# Patient Record
Sex: Female | Born: 1980 | Race: White | Hispanic: No | Marital: Married | Smoking: Never smoker
Health system: Southern US, Community
[De-identification: ages and names within clinical notes are randomized; demographics above are authoritative.]

## PROBLEM LIST (undated history)

## (undated) ENCOUNTER — Inpatient Hospital Stay: Payer: Self-pay

## (undated) DIAGNOSIS — Z8489 Family history of other specified conditions: Secondary | ICD-10-CM

## (undated) DIAGNOSIS — O09299 Supervision of pregnancy with other poor reproductive or obstetric history, unspecified trimester: Secondary | ICD-10-CM

## (undated) DIAGNOSIS — C4491 Basal cell carcinoma of skin, unspecified: Secondary | ICD-10-CM

## (undated) DIAGNOSIS — R000 Tachycardia, unspecified: Secondary | ICD-10-CM

## (undated) DIAGNOSIS — T8859XA Other complications of anesthesia, initial encounter: Secondary | ICD-10-CM

## (undated) DIAGNOSIS — O4290 Premature rupture of membranes, unspecified as to length of time between rupture and onset of labor, unspecified weeks of gestation: Secondary | ICD-10-CM

## (undated) DIAGNOSIS — G4730 Sleep apnea, unspecified: Secondary | ICD-10-CM

## (undated) DIAGNOSIS — R42 Dizziness and giddiness: Secondary | ICD-10-CM

## (undated) DIAGNOSIS — Z6841 Body Mass Index (BMI) 40.0 and over, adult: Secondary | ICD-10-CM

## (undated) DIAGNOSIS — N390 Urinary tract infection, site not specified: Secondary | ICD-10-CM

## (undated) DIAGNOSIS — K219 Gastro-esophageal reflux disease without esophagitis: Secondary | ICD-10-CM

## (undated) DIAGNOSIS — O26849 Uterine size-date discrepancy, unspecified trimester: Secondary | ICD-10-CM

## (undated) DIAGNOSIS — R7309 Other abnormal glucose: Secondary | ICD-10-CM

## (undated) HISTORY — DX: Urinary tract infection, site not specified: N39.0

## (undated) HISTORY — DX: Premature rupture of membranes, unspecified as to length of time between rupture and onset of labor, unspecified weeks of gestation: O42.90

## (undated) HISTORY — DX: Supervision of pregnancy with other poor reproductive or obstetric history, unspecified trimester: O09.299

## (undated) HISTORY — DX: Other abnormal glucose: R73.09

## (undated) HISTORY — DX: Body Mass Index (BMI) 40.0 and over, adult: Z684

## (undated) HISTORY — DX: Basal cell carcinoma of skin, unspecified: C44.91

## (undated) HISTORY — DX: Morbid (severe) obesity due to excess calories: E66.01

## (undated) HISTORY — DX: Uterine size-date discrepancy, unspecified trimester: O26.849

---

## 2005-11-09 HISTORY — PX: CHOLECYSTECTOMY: SHX55

## 2006-11-09 DIAGNOSIS — O09299 Supervision of pregnancy with other poor reproductive or obstetric history, unspecified trimester: Secondary | ICD-10-CM

## 2006-11-09 HISTORY — DX: Supervision of pregnancy with other poor reproductive or obstetric history, unspecified trimester: O09.299

## 2008-11-09 HISTORY — PX: LAPAROSCOPIC BILATERAL SALPINGECTOMY: SHX5889

## 2008-11-09 HISTORY — PX: APPENDECTOMY: SHX54

## 2013-05-01 ENCOUNTER — Inpatient Hospital Stay: Payer: Self-pay | Admitting: Obstetrics and Gynecology

## 2013-05-01 LAB — BASIC METABOLIC PANEL
BUN: 9 mg/dL (ref 7–18)
Calcium, Total: 8.6 mg/dL (ref 8.5–10.1)
Co2: 21 mmol/L (ref 21–32)
Creatinine: 0.62 mg/dL (ref 0.60–1.30)
EGFR (Non-African Amer.): >60
Osmolality: 276 (ref 275–301)
Sodium: 139 mmol/L (ref 136–145)

## 2013-05-01 LAB — CREATININE, URINE, RANDOM: Creatinine, Urine Random: 46.5 mg/dL (ref 30.0–125.0)

## 2013-05-01 LAB — WBC: WBC: 18.3 10*3/uL — ABNORMAL HIGH (ref 3.6–11.0)

## 2013-05-01 LAB — URIC ACID: Uric Acid: 4.4 mg/dL (ref 2.6–6.0)

## 2013-05-01 LAB — PLATELET COUNT: Platelet: 167 10*3/uL (ref 150–440)

## 2013-05-01 LAB — HEMOGLOBIN: HGB: 12.4 g/dL (ref 12.0–16.0)

## 2013-05-01 LAB — HEMATOCRIT: HCT: 35.8 % (ref 35.0–47.0)

## 2013-05-01 LAB — RBC: RBC: 3.98 10*6/uL (ref 3.80–5.20)

## 2013-05-01 LAB — PROTEIN, URINE, RANDOM: Protein, Random Urine: 11 mg/dL (ref 0–12)

## 2013-05-02 LAB — HEMATOCRIT: HCT: 31.6 % — ABNORMAL LOW (ref 35.0–47.0)

## 2014-11-06 LAB — OB RESULTS CONSOLE RPR: RPR: NONREACTIVE

## 2014-11-06 LAB — OB RESULTS CONSOLE PLATELET COUNT: Platelets: 250 10*3/uL

## 2014-11-06 LAB — OB RESULTS CONSOLE ABO/RH: RH TYPE: NEGATIVE

## 2014-11-06 LAB — OB RESULTS CONSOLE GC/CHLAMYDIA
Chlamydia: NEGATIVE
Gonorrhea: NEGATIVE

## 2014-11-06 LAB — OB RESULTS CONSOLE TSH: TSH: 2.59

## 2014-11-06 LAB — OB RESULTS CONSOLE VARICELLA ZOSTER ANTIBODY, IGG: Varicella: IMMUNE

## 2014-11-06 LAB — OB RESULTS CONSOLE HGB/HCT, BLOOD
HCT: 38 %
Hemoglobin: 13 g/dL

## 2014-11-06 LAB — OB RESULTS CONSOLE ANTIBODY SCREEN: ANTIBODY SCREEN: NEGATIVE

## 2014-11-06 LAB — OB RESULTS CONSOLE HEPATITIS B SURFACE ANTIGEN: Hepatitis B Surface Ag: NEGATIVE

## 2014-11-06 LAB — OB RESULTS CONSOLE RUBELLA ANTIBODY, IGM: Rubella: IMMUNE

## 2014-11-06 LAB — OB RESULTS CONSOLE HIV ANTIBODY (ROUTINE TESTING): HIV: NONREACTIVE

## 2014-11-09 NOTE — L&D Delivery Note (Signed)
Delivery Note At 3:34 PM a viable female was delivered via Vaginal, Spontaneous Delivery (Presentation: ; Occiput Anterior).  APGAR: 8, 9; weight  .   Placenta status: Intact, Spontaneous Expressed.  Cord: 3 vessels with the following complications: None.  Cord pH: na  Anesthesia: Epidural  Episiotomy: Median Lacerations:  None Suture Repair: 2.0 chromic Est. Blood Loss (mL): 500  Mom to postpartum.  Baby to Couplet care / Skin to Skin.  Hassell Done A Kijana Cromie 05/22/2015, 3:56 PM

## 2015-03-05 LAB — OB RESULTS CONSOLE HGB/HCT, BLOOD
HCT: 35 %
Hemoglobin: 12 g/dL

## 2015-03-14 ENCOUNTER — Observation Stay
Admission: EM | Admit: 2015-03-14 | Discharge: 2015-03-14 | Disposition: A | Discharge status: Home / Self Care | Payer: BLUE CROSS/BLUE SHIELD | Attending: Obstetrics and Gynecology | Admitting: Obstetrics and Gynecology

## 2015-03-14 DIAGNOSIS — Z3A29 29 weeks gestation of pregnancy: Secondary | ICD-10-CM | POA: Diagnosis not present

## 2015-03-14 LAB — URINALYSIS COMPLETE WITH MICROSCOPIC (ARMC ONLY)
BILIRUBIN URINE: NEGATIVE
Bacteria, UA: NONE SEEN
Glucose, UA: NEGATIVE mg/dL
HGB URINE DIPSTICK: NEGATIVE
Ketones, ur: NEGATIVE mg/dL
LEUKOCYTES UA: NEGATIVE
NITRITE: NEGATIVE
Protein, ur: NEGATIVE mg/dL
Specific Gravity, Urine: 1.013 (ref 1.005–1.030)
pH: 6 (ref 5.0–8.0)

## 2015-03-19 NOTE — H&P (Signed)
L&D Evaluation:  History:  HPI 32 yowmf G3P1011 admitted in labor. [facility] 05/09/2013. EGA 38.[redacted] weeks gestation.   Patient's Medical History Increased BMI; Rh-, s/p Rhogam; H/O PIH prior pregnancy   Patient's Surgical History none   Medications Pre Natal Vitamins   Allergies NKDA   Social History none   Family History Non-Contributory   ROS:  ROS All systems were reviewed.  HEENT, CNS, GI, GU, Respiratory, CV, Renal and Musculoskeletal systems were found to be normal.   Exam:  Vital Signs stable   Urine Protein negative dipstick   General no apparent distress   Mental Status clear   Heart normal sinus rhythm   Abdomen gravid, non-tender, 8#8   Estimated Fetal Weight Average for gestational age   Back no CVAT   Edema 1+   Pelvic no external lesions, 7/90/-1/vtx/bowi bulging bag.   Mebranes AROM-Clear IUPC placed   FHT normal rate with no decels   Ucx regular   Skin dry   Lymph no lymphadenopathy   Other -/ATB-/NR/REI/HB-/HIV-/Glucola 119/GBS-   Impression:  Impression active labor, evaluation for PIH   Plan:  Plan monitor contractions and for cervical change, PIH panel   Comments PIH panel wnl. Epidural functional.   Electronic Signatures: DeFrancesco, Alanda Slim (MD)  (Signed 23-Jun-14 08:00)  Authored: L&D Evaluation   Last Updated: 23-Jun-14 08:00 by DeFrancesco, Alanda Slim (MD)

## 2015-03-21 DIAGNOSIS — O4290 Premature rupture of membranes, unspecified as to length of time between rupture and onset of labor, unspecified weeks of gestation: Secondary | ICD-10-CM

## 2015-03-21 HISTORY — DX: Premature rupture of membranes, unspecified as to length of time between rupture and onset of labor, unspecified weeks of gestation: O42.90

## 2015-04-18 ENCOUNTER — Encounter: Payer: Self-pay | Admitting: Obstetrics and Gynecology

## 2015-04-18 ENCOUNTER — Ambulatory Visit (INDEPENDENT_AMBULATORY_CARE_PROVIDER_SITE_OTHER): Payer: BLUE CROSS/BLUE SHIELD | Admitting: Obstetrics and Gynecology

## 2015-04-18 VITALS — BP 127/80 | HR 98 | Ht 68.0 in | Wt 264.3 lb

## 2015-04-18 DIAGNOSIS — Z331 Pregnant state, incidental: Secondary | ICD-10-CM

## 2015-04-18 DIAGNOSIS — Z1389 Encounter for screening for other disorder: Secondary | ICD-10-CM

## 2015-04-18 LAB — POCT URINALYSIS DIPSTICK
Bilirubin, UA: NEGATIVE
Glucose, UA: NEGATIVE
KETONES UA: NEGATIVE
Nitrite, UA: NEGATIVE
PH UA: 6
Protein, UA: NEGATIVE
RBC UA: NEGATIVE
SPEC GRAV UA: 1.015
Urobilinogen, UA: 0.2

## 2015-04-19 DIAGNOSIS — Z6831 Body mass index (BMI) 31.0-31.9, adult: Secondary | ICD-10-CM | POA: Insufficient documentation

## 2015-04-19 NOTE — Progress Notes (Signed)
ZIKA EXPOSURE SCREEN:  The patient has not traveled to a [place] endemic area within the past 6 months, nor has she had unprotected sex with a partner who has travelled to a [place] endemic region within the past 6 months. The patient has been advised to notify us if these factors change any time during this current pregnancy, so adequate testing and monitoring can be initiated.

## 2015-05-02 ENCOUNTER — Ambulatory Visit (INDEPENDENT_AMBULATORY_CARE_PROVIDER_SITE_OTHER): Payer: BLUE CROSS/BLUE SHIELD | Admitting: Obstetrics and Gynecology

## 2015-05-02 ENCOUNTER — Encounter: Payer: Self-pay | Admitting: Obstetrics and Gynecology

## 2015-05-02 VITALS — BP 129/82 | HR 92 | Wt 262.3 lb

## 2015-05-02 DIAGNOSIS — Z3685 Encounter for antenatal screening for Streptococcus B: Secondary | ICD-10-CM

## 2015-05-02 DIAGNOSIS — Z113 Encounter for screening for infections with a predominantly sexual mode of transmission: Secondary | ICD-10-CM

## 2015-05-02 DIAGNOSIS — Z3493 Encounter for supervision of normal pregnancy, unspecified, third trimester: Secondary | ICD-10-CM

## 2015-05-02 LAB — POCT URINALYSIS DIPSTICK
BILIRUBIN UA: NEGATIVE
Glucose, UA: NEGATIVE
KETONES UA: NEGATIVE
Leukocytes, UA: NEGATIVE
Nitrite, UA: NEGATIVE
PH UA: 6
PROTEIN UA: NEGATIVE
RBC UA: NEGATIVE
SPEC GRAV UA: 1.015
Urobilinogen, UA: 0.2

## 2015-05-02 NOTE — Progress Notes (Signed)
Pt c/o of ctx on off x 2 weeks. Very painful today. Nauseous and diarrhea.

## 2015-05-02 NOTE — Progress Notes (Signed)
36 week cultures done.Lightheadedness and fatigue today.  Vital signs normal.  Patient encouraged to maintain hydration stayout of direct sunlight.

## 2015-05-04 LAB — GC/CHLAMYDIA PROBE AMP
CHLAMYDIA, DNA PROBE: NEGATIVE
Neisseria gonorrhoeae by PCR: NEGATIVE

## 2015-05-04 LAB — STREP GP B NAA: Strep Gp B NAA: NEGATIVE

## 2015-05-06 ENCOUNTER — Encounter: Payer: Self-pay | Admitting: *Deleted

## 2015-05-06 ENCOUNTER — Telehealth: Payer: Self-pay

## 2015-05-06 ENCOUNTER — Observation Stay
Admission: EM | Admit: 2015-05-06 | Discharge: 2015-05-06 | Disposition: A | Discharge status: Home / Self Care | Payer: BLUE CROSS/BLUE SHIELD | Attending: Obstetrics and Gynecology | Admitting: Obstetrics and Gynecology

## 2015-05-06 DIAGNOSIS — Z3490 Encounter for supervision of normal pregnancy, unspecified, unspecified trimester: Principal | ICD-10-CM

## 2015-05-06 LAB — COMPREHENSIVE METABOLIC PANEL
ALBUMIN: 2.6 g/dL — AB (ref 3.5–5.0)
ALT: 29 U/L (ref 14–54)
ANION GAP: 8 (ref 5–15)
AST: 22 U/L (ref 15–41)
Alkaline Phosphatase: 148 U/L — ABNORMAL HIGH (ref 38–126)
BUN: 10 mg/dL (ref 6–20)
CALCIUM: 8.6 mg/dL — AB (ref 8.9–10.3)
CO2: 22 mmol/L (ref 22–32)
CREATININE: 0.63 mg/dL (ref 0.44–1.00)
Chloride: 106 mmol/L (ref 101–111)
GFR calc Af Amer: >60 mL/min (ref >60)
GFR calc non Af Amer: >60 mL/min (ref >60)
Glucose, Bld: 96 mg/dL (ref 65–99)
Potassium: 3.7 mmol/L (ref 3.5–5.1)
Sodium: 136 mmol/L (ref 135–145)
Total Bilirubin: 0.5 mg/dL (ref 0.3–1.2)
Total Protein: 6.7 g/dL (ref 6.5–8.1)

## 2015-05-06 LAB — PROTEIN / CREATININE RATIO, URINE
Creatinine, Urine: 71 mg/dL
PROTEIN CREATININE RATIO: 0.13 mg/mg{creat} (ref 0.00–0.15)
Total Protein, Urine: 9 mg/dL

## 2015-05-06 LAB — URIC ACID: Uric Acid, Serum: 4.4 mg/dL (ref 2.3–6.6)

## 2015-05-06 LAB — CBC
HEMATOCRIT: 36 % (ref 35.0–47.0)
Hemoglobin: 11.6 g/dL — ABNORMAL LOW (ref 12.0–16.0)
MCH: 27.9 pg (ref 26.0–34.0)
MCHC: 32.3 g/dL (ref 32.0–36.0)
MCV: 86.4 fL (ref 80.0–100.0)
Platelets: 191 10*3/uL (ref 150–440)
RBC: 4.16 MIL/uL (ref 3.80–5.20)
RDW: 15 % — ABNORMAL HIGH (ref 11.5–14.5)
WBC: 15.2 10*3/uL — ABNORMAL HIGH (ref 3.6–11.0)

## 2015-05-06 LAB — OB RESULTS CONSOLE GBS: GBS: NEGATIVE

## 2015-05-06 NOTE — Discharge Instructions (Signed)
Drink plenty of water.  Warm baths and pillows for comfort. May use Tylenol for pain as needed.  Call for change in intensity of contractions, heavy bleeding, rupture of membranes or decreased fetal movement.

## 2015-05-06 NOTE — Telephone Encounter (Signed)
Pt states in the middle of the nite she woke up to urinate and felt a popping type sensation. Pos fm. She has been having ctx on/off x 2 weeks. NO vag bleeding. 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 Advised to go inside and put on dry underwear. Monitor for now. If she starts to leak fluid or has vaginal bleeding she will let us know. Pt will f/u with Mad tomorrow.

## 2015-05-07 ENCOUNTER — Encounter: Payer: Self-pay | Admitting: Obstetrics and Gynecology

## 2015-05-07 ENCOUNTER — Ambulatory Visit (INDEPENDENT_AMBULATORY_CARE_PROVIDER_SITE_OTHER): Payer: BLUE CROSS/BLUE SHIELD | Admitting: Obstetrics and Gynecology

## 2015-05-07 VITALS — BP 132/83 | HR 89 | Wt 264.4 lb

## 2015-05-07 DIAGNOSIS — Z3483 Encounter for supervision of other normal pregnancy, third trimester: Secondary | ICD-10-CM

## 2015-05-07 DIAGNOSIS — Z3493 Encounter for supervision of normal pregnancy, unspecified, third trimester: Secondary | ICD-10-CM

## 2015-05-07 LAB — POCT URINALYSIS DIPSTICK
BILIRUBIN UA: NEGATIVE
Glucose, UA: NEGATIVE
KETONES UA: NEGATIVE
LEUKOCYTES UA: NEGATIVE
Nitrite, UA: NEGATIVE
PH UA: 6
PROTEIN UA: NEGATIVE
RBC UA: NEGATIVE
Spec Grav, UA: <=1.005
Urobilinogen, UA: 0.2

## 2015-05-07 NOTE — Progress Notes (Signed)
No evidence of ROM. Precautions given.Marland Kitchen

## 2015-05-07 NOTE — Progress Notes (Signed)
Ctx on/off. Seen in L&D. Nst reactive.

## 2015-05-14 ENCOUNTER — Encounter: Payer: Self-pay | Admitting: Obstetrics and Gynecology

## 2015-05-14 ENCOUNTER — Ambulatory Visit (INDEPENDENT_AMBULATORY_CARE_PROVIDER_SITE_OTHER): Payer: BLUE CROSS/BLUE SHIELD | Admitting: Obstetrics and Gynecology

## 2015-05-14 VITALS — BP 121/84 | HR 97 | Wt 267.7 lb

## 2015-05-14 DIAGNOSIS — Z3493 Encounter for supervision of normal pregnancy, unspecified, third trimester: Secondary | ICD-10-CM

## 2015-05-14 LAB — POCT URINALYSIS DIPSTICK
BILIRUBIN UA: NEGATIVE
Blood, UA: NEGATIVE
GLUCOSE UA: NEGATIVE
KETONES UA: NEGATIVE
LEUKOCYTES UA: NEGATIVE
Nitrite, UA: NEGATIVE
Protein, UA: NEGATIVE
Spec Grav, UA: 1.02
Urobilinogen, UA: 0.2
pH, UA: 6

## 2015-05-14 NOTE — Progress Notes (Signed)
Patient notes regular contractions and backache: Cervix unchanged.Consider induction of labor at 39.0 weeks

## 2015-05-14 NOTE — Progress Notes (Signed)
No complaints

## 2015-05-14 NOTE — Progress Notes (Signed)
Contractions continue throughout the day. Low back pain during contractions. No vaginal bleeding.

## 2015-05-21 ENCOUNTER — Inpatient Hospital Stay
Admission: EM | Admit: 2015-05-21 | Discharge: 2015-05-24 | DRG: 775 | Disposition: A | Discharge status: Home / Self Care | Payer: BLUE CROSS/BLUE SHIELD | Attending: Obstetrics and Gynecology | Admitting: Obstetrics and Gynecology

## 2015-05-21 ENCOUNTER — Ambulatory Visit (INDEPENDENT_AMBULATORY_CARE_PROVIDER_SITE_OTHER): Payer: BLUE CROSS/BLUE SHIELD | Admitting: Obstetrics and Gynecology

## 2015-05-21 ENCOUNTER — Encounter: Payer: Self-pay | Admitting: Obstetrics and Gynecology

## 2015-05-21 VITALS — BP 132/82 | HR 98 | Wt 268.9 lb

## 2015-05-21 DIAGNOSIS — O99214 Obesity complicating childbirth: Secondary | ICD-10-CM | POA: Diagnosis present

## 2015-05-21 DIAGNOSIS — O894 Spinal and epidural anesthesia-induced headache during the puerperium: Secondary | ICD-10-CM | POA: Diagnosis not present

## 2015-05-21 DIAGNOSIS — Z3483 Encounter for supervision of other normal pregnancy, third trimester: Secondary | ICD-10-CM | POA: Diagnosis not present

## 2015-05-21 DIAGNOSIS — Z3A39 39 weeks gestation of pregnancy: Secondary | ICD-10-CM | POA: Diagnosis present

## 2015-05-21 DIAGNOSIS — T413X5A Adverse effect of local anesthetics, initial encounter: Secondary | ICD-10-CM | POA: Diagnosis not present

## 2015-05-21 DIAGNOSIS — Z3493 Encounter for supervision of normal pregnancy, unspecified, third trimester: Secondary | ICD-10-CM

## 2015-05-21 DIAGNOSIS — Z6841 Body Mass Index (BMI) 40.0 and over, adult: Secondary | ICD-10-CM | POA: Diagnosis not present

## 2015-05-21 DIAGNOSIS — O4292 Full-term premature rupture of membranes, unspecified as to length of time between rupture and onset of labor: Principal | ICD-10-CM | POA: Diagnosis present

## 2015-05-21 DIAGNOSIS — Z3490 Encounter for supervision of normal pregnancy, unspecified, unspecified trimester: Secondary | ICD-10-CM

## 2015-05-21 LAB — POCT URINALYSIS DIPSTICK
BILIRUBIN UA: NEGATIVE
Glucose, UA: NEGATIVE
KETONES UA: NEGATIVE
Leukocytes, UA: NEGATIVE
Nitrite, UA: NEGATIVE
PH UA: 6
PROTEIN UA: NEGATIVE
RBC UA: NEGATIVE
Spec Grav, UA: 1.015
Urobilinogen, UA: 0.2

## 2015-05-21 LAB — CBC
HCT: 33.6 % — ABNORMAL LOW (ref 35.0–47.0)
Hemoglobin: 10.9 g/dL — ABNORMAL LOW (ref 12.0–16.0)
MCH: 27.6 pg (ref 26.0–34.0)
MCHC: 32.3 g/dL (ref 32.0–36.0)
MCV: 85.5 fL (ref 80.0–100.0)
Platelets: 156 10*3/uL (ref 150–440)
RBC: 3.93 MIL/uL (ref 3.80–5.20)
RDW: 16.1 % — ABNORMAL HIGH (ref 11.5–14.5)
WBC: 15.8 10*3/uL — ABNORMAL HIGH (ref 3.6–11.0)

## 2015-05-21 MED ORDER — LACTATED RINGERS IV SOLN
500.0000 mL | INTRAVENOUS | Status: DC | PRN
  Administered 2015-05-22: 1000 mL via INTRAVENOUS

## 2015-05-21 MED ORDER — SODIUM CHLORIDE 0.9 % IV SOLN: 1.0000 g | INTRAVENOUS | Status: DC

## 2015-05-21 MED ORDER — NYSTATIN-TRIAMCINOLONE 100000-0.1 UNIT/GM-% EX CREA: 1.0000 "application " | TOPICAL_CREAM | Freq: Two times a day (BID) | CUTANEOUS | Status: DC

## 2015-05-21 MED ORDER — TERBUTALINE SULFATE 1 MG/ML IJ SOLN: 0.2500 mg | Freq: Once | INTRAMUSCULAR | Status: AC | PRN

## 2015-05-21 MED ORDER — ONDANSETRON HCL 4 MG/2ML IJ SOLN: 4.0000 mg | Freq: Four times a day (QID) | INTRAMUSCULAR | Status: DC | PRN

## 2015-05-21 MED ORDER — MISOPROSTOL 25 MCG QUARTER TABLET
25.0000 ug | ORAL_TABLET | ORAL | Status: DC | PRN
  Administered 2015-05-21 – 2015-05-22 (×2): 25 ug via VAGINAL
  Filled 2015-05-21: qty 1

## 2015-05-21 MED ORDER — MISOPROSTOL 25 MCG QUARTER TABLET
ORAL_TABLET | ORAL | Status: AC
  Administered 2015-05-21: 25 ug via VAGINAL
  Filled 2015-05-21: qty 0.25

## 2015-05-21 MED ORDER — ACETAMINOPHEN 325 MG PO TABS
650.0000 mg | ORAL_TABLET | ORAL | Status: DC | PRN
Start: 2015-05-21 — End: 2015-05-23
  Filled 2015-05-21: qty 2

## 2015-05-21 MED ORDER — LACTATED RINGERS IV SOLN
INTRAVENOUS | Status: DC
  Administered 2015-05-21 – 2015-05-22 (×2): 125 mL/h via INTRAVENOUS
  Administered 2015-05-22: 10:00:00 via INTRAVENOUS

## 2015-05-21 MED ORDER — FENTANYL CITRATE (PF) 100 MCG/2ML IJ SOLN: 50.0000 ug | INTRAMUSCULAR | Status: DC | PRN

## 2015-05-21 MED ORDER — OXYCODONE-ACETAMINOPHEN 5-325 MG PO TABS
1.0000 | ORAL_TABLET | ORAL | Status: DC | PRN
  Filled 2015-05-21 (×2): qty 1

## 2015-05-21 MED ORDER — CITRIC ACID-SODIUM CITRATE 334-500 MG/5ML PO SOLN: 30.0000 mL | ORAL | Status: DC | PRN

## 2015-05-21 MED ORDER — LIDOCAINE HCL (PF) 1 % IJ SOLN
30.0000 mL | INTRAMUSCULAR | Status: DC | PRN
  Filled 2015-05-21: qty 30

## 2015-05-21 MED ORDER — OXYCODONE-ACETAMINOPHEN 5-325 MG PO TABS: 2.0000 | ORAL_TABLET | ORAL | Status: DC | PRN

## 2015-05-21 MED ORDER — OXYTOCIN 40 UNITS IN LACTATED RINGERS INFUSION - SIMPLE MED
62.5000 mL/h | INTRAVENOUS | Status: DC
  Filled 2015-05-21: qty 1000

## 2015-05-21 MED ORDER — OXYTOCIN BOLUS FROM INFUSION: 500.0000 mL | INTRAVENOUS | Status: DC

## 2015-05-21 NOTE — Progress Notes (Signed)
IOL Tonight-Cytotec/Pitocin. EFW 4000 gm.

## 2015-05-21 NOTE — Progress Notes (Signed)
Pt c/o of bumps on left side vulvar- painful to touch.

## 2015-05-22 ENCOUNTER — Inpatient Hospital Stay: Payer: BLUE CROSS/BLUE SHIELD | Admitting: Anesthesiology

## 2015-05-22 ENCOUNTER — Encounter: Payer: Self-pay | Admitting: *Deleted

## 2015-05-22 DIAGNOSIS — Z3483 Encounter for supervision of other normal pregnancy, third trimester: Secondary | ICD-10-CM

## 2015-05-22 LAB — ABO/RH: ABO/RH(D): A NEG

## 2015-05-22 MED ORDER — ACETAMINOPHEN 325 MG PO TABS
650.0000 mg | ORAL_TABLET | ORAL | Status: DC | PRN
Start: 2015-05-22 — End: 2015-05-24

## 2015-05-22 MED ORDER — PRENATAL MULTIVITAMIN CH
1.0000 | ORAL_TABLET | Freq: Every day | ORAL | Status: DC
  Administered 2015-05-23 – 2015-05-24 (×2): 1 via ORAL
  Filled 2015-05-22 (×2): qty 1

## 2015-05-22 MED ORDER — SIMETHICONE 80 MG PO CHEW: 80.0000 mg | CHEWABLE_TABLET | ORAL | Status: DC | PRN

## 2015-05-22 MED ORDER — LANOLIN HYDROUS EX OINT: TOPICAL_OINTMENT | CUTANEOUS | Status: DC | PRN

## 2015-05-22 MED ORDER — FENTANYL 2.5 MCG/ML W/ROPIVACAINE 0.2% IN NS 100 ML EPIDURAL INFUSION (ARMC-ANES)
10.0000 mL/h | EPIDURAL | Status: DC
  Administered 2015-05-22: 1 mL/h via EPIDURAL
  Filled 2015-05-22: qty 100

## 2015-05-22 MED ORDER — DIBUCAINE 1 % RE OINT: 1.0000 "application " | TOPICAL_OINTMENT | RECTAL | Status: DC | PRN

## 2015-05-22 MED ORDER — MISOPROSTOL 25 MCG QUARTER TABLET
ORAL_TABLET | ORAL | Status: AC
  Administered 2015-05-22: 25 ug via VAGINAL
  Filled 2015-05-22: qty 0.25

## 2015-05-22 MED ORDER — TETANUS-DIPHTH-ACELL PERTUSSIS 5-2.5-18.5 LF-MCG/0.5 IM SUSP: 0.5000 mL | INTRAMUSCULAR | Status: DC | PRN

## 2015-05-22 MED ORDER — LIDOCAINE-EPINEPHRINE (PF) 1.5 %-1:200000 IJ SOLN
INTRAMUSCULAR | Status: DC | PRN
  Administered 2015-05-22: 3 mL via PERINEURAL
  Administered 2015-05-22: 3 mL via EPIDURAL

## 2015-05-22 MED ORDER — WITCH HAZEL-GLYCERIN EX PADS: 1.0000 "application " | MEDICATED_PAD | CUTANEOUS | Status: DC | PRN

## 2015-05-22 MED ORDER — DIPHENHYDRAMINE HCL 25 MG PO CAPS: 25.0000 mg | ORAL_CAPSULE | Freq: Four times a day (QID) | ORAL | Status: DC | PRN

## 2015-05-22 MED ORDER — ONDANSETRON HCL 4 MG PO TABS: 4.0000 mg | ORAL_TABLET | ORAL | Status: DC | PRN

## 2015-05-22 MED ORDER — OXYTOCIN 40 UNITS IN LACTATED RINGERS INFUSION - SIMPLE MED
1.0000 m[IU]/min | INTRAVENOUS | Status: DC
  Administered 2015-05-22: 1 m[IU]/min via INTRAVENOUS
  Administered 2015-05-22: 10 m[IU]/min via INTRAVENOUS
  Filled 2015-05-22: qty 1000

## 2015-05-22 MED ORDER — DOCUSATE SODIUM 100 MG PO CAPS
100.0000 mg | ORAL_CAPSULE | Freq: Two times a day (BID) | ORAL | Status: DC
  Administered 2015-05-23 – 2015-05-24 (×3): 100 mg via ORAL
  Filled 2015-05-22 (×3): qty 1

## 2015-05-22 MED ORDER — MISOPROSTOL 200 MCG PO TABS
ORAL_TABLET | ORAL | Status: AC
  Filled 2015-05-22: qty 4

## 2015-05-22 MED ORDER — EPHEDRINE 5 MG/ML INJ
10.0000 mg | INTRAVENOUS | Status: DC | PRN
  Administered 2015-05-22: 10 mg via INTRAVENOUS
  Filled 2015-05-22: qty 10
  Filled 2015-05-22: qty 2

## 2015-05-22 MED ORDER — OXYTOCIN 10 UNIT/ML IJ SOLN
INTRAMUSCULAR | Status: AC
  Administered 2015-05-22: 10 [IU]
  Filled 2015-05-22: qty 2

## 2015-05-22 MED ORDER — AMMONIA AROMATIC IN INHA
RESPIRATORY_TRACT | Status: AC
  Filled 2015-05-22: qty 10

## 2015-05-22 MED ORDER — ONDANSETRON HCL 4 MG/2ML IJ SOLN: 4.0000 mg | INTRAMUSCULAR | Status: DC | PRN

## 2015-05-22 MED ORDER — FERROUS SULFATE 325 (65 FE) MG PO TABS
325.0000 mg | ORAL_TABLET | Freq: Two times a day (BID) | ORAL | Status: DC
  Administered 2015-05-23 – 2015-05-24 (×3): 325 mg via ORAL
  Filled 2015-05-22 (×4): qty 1

## 2015-05-22 MED ORDER — PHENYLEPHRINE 40 MCG/ML (10ML) SYRINGE FOR IV PUSH (FOR BLOOD PRESSURE SUPPORT)
80.0000 ug | PREFILLED_SYRINGE | INTRAVENOUS | Status: DC | PRN
  Filled 2015-05-22: qty 2

## 2015-05-22 MED ORDER — LIDOCAINE HCL (PF) 1 % IJ SOLN
INTRAMUSCULAR | Status: AC
  Filled 2015-05-22: qty 30

## 2015-05-22 MED ORDER — DIPHENHYDRAMINE HCL 50 MG/ML IJ SOLN: 12.5000 mg | INTRAMUSCULAR | Status: DC | PRN

## 2015-05-22 MED ORDER — BENZOCAINE-MENTHOL 20-0.5 % EX AERO
INHALATION_SPRAY | CUTANEOUS | Status: AC
  Filled 2015-05-22: qty 56

## 2015-05-22 MED ORDER — BUPIVACAINE HCL (PF) 0.25 % IJ SOLN
INTRAMUSCULAR | Status: DC | PRN
  Administered 2015-05-22 (×2): 4 mL

## 2015-05-22 MED ORDER — OXYCODONE-ACETAMINOPHEN 5-325 MG PO TABS
1.0000 | ORAL_TABLET | ORAL | Status: DC | PRN
  Administered 2015-05-22 – 2015-05-23 (×4): 1 via ORAL
  Filled 2015-05-22 (×2): qty 1

## 2015-05-22 MED ORDER — TERBUTALINE SULFATE 1 MG/ML IJ SOLN
0.2500 mg | Freq: Once | INTRAMUSCULAR | Status: AC | PRN
  Filled 2015-05-22: qty 1

## 2015-05-22 MED ORDER — BENZOCAINE-MENTHOL 20-0.5 % EX AERO
1.0000 "application " | INHALATION_SPRAY | CUTANEOUS | Status: DC | PRN
  Filled 2015-05-22: qty 56

## 2015-05-22 MED ORDER — OXYCODONE-ACETAMINOPHEN 5-325 MG PO TABS
2.0000 | ORAL_TABLET | ORAL | Status: DC | PRN
  Administered 2015-05-23 – 2015-05-24 (×3): 2 via ORAL
  Filled 2015-05-22 (×3): qty 2

## 2015-05-22 MED ORDER — LIDOCAINE HCL (PF) 1 % IJ SOLN
INTRAMUSCULAR | Status: DC | PRN
  Administered 2015-05-22 (×2): 3 mL via SUBCUTANEOUS

## 2015-05-22 MED ORDER — MEASLES, MUMPS & RUBELLA VAC ~~LOC~~ INJ: 0.5000 mL | INJECTION | Freq: Once | SUBCUTANEOUS | Status: DC

## 2015-05-22 MED ORDER — IBUPROFEN 800 MG PO TABS
800.0000 mg | ORAL_TABLET | Freq: Three times a day (TID) | ORAL | Status: DC
  Administered 2015-05-22 – 2015-05-24 (×6): 800 mg via ORAL
  Filled 2015-05-22 (×6): qty 1

## 2015-05-22 NOTE — Anesthesia Preprocedure Evaluation (Addendum)
Anesthesia Evaluation  Patient identified by MRN, date of birth, ID band Patient awake    Reviewed: Allergy & Precautions, NPO status , Patient's Chart, lab work & pertinent test results  History of Anesthesia Complications Negative for: history of anesthetic complications  Airway Mallampati: II  TM Distance: >3 FB Neck ROM: Full    Dental no notable dental hx.    Pulmonary neg pulmonary ROS,  breath sounds clear to auscultation  Pulmonary exam normal       Cardiovascular negative cardio ROS Normal cardiovascular exam+ dysrhythmias (pt states she has intermittent tacycardia) Rhythm:Regular Rate:Normal     Neuro/Psych negative neurological ROS  negative psych ROS   GI/Hepatic negative GI ROS, Neg liver ROS,   Endo/Other  negative endocrine ROS  Renal/GU negative Renal ROS  negative genitourinary   Musculoskeletal negative musculoskeletal ROS (+)   Abdominal   Peds negative pediatric ROS (+)  Hematology negative hematology ROS (+)   Anesthesia Other Findings   Reproductive/Obstetrics (+) Pregnancy                            Anesthesia Physical Anesthesia Plan  ASA: II  Anesthesia Plan: Epidural   Post-op Pain Management:    Induction:   Airway Management Planned:   Additional Equipment:   Intra-op Plan:   Post-operative Plan:   Informed Consent: I have reviewed the patients History and Physical, chart, labs and discussed the procedure including the risks, benefits and alternatives for the proposed anesthesia with the patient or authorized representative who has indicated his/her understanding and acceptance.     Plan Discussed with:   Anesthesia Plan Comments:         Anesthesia Quick Evaluation

## 2015-05-22 NOTE — H&P (Signed)
Delancey P Werden is a 34 y.o. female presenting for Cytotec/Pitocin IOL for Suspected fetal macrosomia at term . History OB History    Gravida Para Term Preterm AB TAB SAB Ectopic Multiple Living   4 2 2  0 1 0 1 0 0 2     Past Medical History  Diagnosis Date  . PROM (premature rupture of membranes) 03/21/2015    returning neg; nitrazine ?, betamehasone 12.5mg  IM q 24h x2, afi tomorrow and weekly afi 03/22/2015-13.5cm  . Hx of preeclampsia, prior pregnancy, currently pregnant 2008    11/14/2014- plan monitory bp, serial u/s,third trimester antepartum testing, baseline serum creatinite  . Prior fetal macrosomia, antepartum     limit weight gain 10-15pounds, early glucola  . Fetal size inconsistent with dates     03/05/2015- growth scan today 86th percentile growth  . Morbid obesity with BMI of 40.0-44.9, adult   . Elevated glucose tolerance test     3hr wnl  . Urinary tract infection, recurrent     11/14/2014- monthly urine cultures   Past Surgical History  Procedure Laterality Date  . Appendectomy  2010    laparoscopic  . Laparoscopic bilateral salpingectomy  2010    bilateral paratubal cyst excision  . Cholecystectomy  2007   Family History: family history includes Bone cancer in her sister; Diabetes in her father and mother; Hypertension in her father and mother; Rheum arthritis in her mother. Social History:  reports that she has never smoked. She has never used smokeless tobacco. She reports that she does not drink alcohol or use illicit drugs.   Prenatal Transfer Tool  Maternal Diabetes: No Genetic Screening: Normal Maternal Ultrasounds/Referrals: Normal Fetal Ultrasounds or other Referrals:  None Maternal Substance Abuse:  No Significant Maternal Medications:  None Significant Maternal Lab Results:  Lab values include: Group B Strep negative, Rh negative Other Comments:  Rh-; Prior Fetal Macrosomia; Prior Pre Eclampsia  ROS  Dilation: 2 Effacement (%): 70 Station:  -3 Exam by:: T.Roberts RN (cytotec 73mcg's inserted vaginally.) Blood pressure 96/51, pulse 77, temperature 98.1 F (36.7 C), temperature source Oral, resp. rate 18, last menstrual period 08/22/2014, unknown if currently breastfeeding. Exam Physical Exam  Prenatal labs: ABO, Rh: --/--/A NEG (07/12 2209) Antibody: NEG (07/12 2209) Rubella: Immune (12/29 0000) RPR: Nonreactive (12/29 0000)  HBsAg: Negative (12/29 0000)  HIV: Non-reactive (12/29 0000)  GBS: Negative (06/27 0000)   Assessment/Plan: 39.0 week IUP Rh- Prior Fetal Macrosomia Suspected Macrosomia; EFW 9#0     Hassell Done A Deashia Soule 05/22/2015, 7:59 AM

## 2015-05-22 NOTE — Anesthesia Procedure Notes (Addendum)
Epidural Patient location during procedure: OB Start time: 05/22/2015 9:35 AM End time: 05/22/2015 9:45 AM  Staffing Anesthesiologist: Lorane Gell Performed by: anesthesiologist   Preanesthetic Checklist Completed: patient identified, surgical consent, pre-op evaluation, timeout performed, IV checked, risks and benefits discussed and monitors and equipment checked  Epidural Patient position: sitting Prep: Betadine Patient monitoring: heart rate, continuous pulse ox and blood pressure Approach: midline Location: L4-L5 Injection technique: LOR saline  Needle:  Needle type: Tuohy  Needle gauge: 18 G Needle length: 9 cm and 9 Needle insertion depth: 9 cm Catheter type: closed end flexible Catheter size: 20 Guage Catheter at skin depth: 14 cm  Assessment Events: blood not aspirated, injection not painful, no injection resistance, negative IV test and no paresthesia  Additional Notes See quick note.  Patient tolerated the insertion well without complications.Reason for block:procedure for pain  Epidural Patient location during procedure: OB Start time: 05/22/2015 9:05 AM End time: 05/22/2015 9:22 AM  Staffing Anesthesiologist: Lorane Gell Resident/CRNA: Nelda Marseille  Preanesthetic Checklist Completed: patient identified, surgical consent, pre-op evaluation, timeout performed, IV checked, risks and benefits discussed and monitors and equipment checked  Epidural Patient position: sitting Prep: Betadine Patient monitoring: heart rate, continuous pulse ox and blood pressure Approach: midline Location: L3-L4 Injection technique: LOR saline  Needle:  Needle type: Tuohy  Needle gauge: 18 G Needle length: 9 cm and 9 Needle insertion depth: 9 cm Catheter type: closed end flexible Catheter size: 20 Guage Catheter at skin depth: 14 cm Test dose: 1.5% lidocaine with Epi 1:200 K and positive  Assessment Events: blood not aspirated, injection not painful, no injection  resistance, negative IV test and no paresthesia  Additional Notes Test dose positive for numbness to level of umbilicus.    Patient tolerated the insertion well without complications.Reason for block:procedure for pain

## 2015-05-23 LAB — CBC
HCT: 30.5 % — ABNORMAL LOW (ref 35.0–47.0)
Hemoglobin: 10 g/dL — ABNORMAL LOW (ref 12.0–16.0)
MCH: 28 pg (ref 26.0–34.0)
MCHC: 32.7 g/dL (ref 32.0–36.0)
MCV: 85.6 fL (ref 80.0–100.0)
Platelets: 129 10*3/uL — ABNORMAL LOW (ref 150–440)
RBC: 3.56 MIL/uL — ABNORMAL LOW (ref 3.80–5.20)
RDW: 16.4 % — AB (ref 11.5–14.5)
WBC: 15.3 10*3/uL — ABNORMAL HIGH (ref 3.6–11.0)

## 2015-05-23 LAB — RPR: RPR Ser Ql: NONREACTIVE

## 2015-05-23 MED ORDER — RHO D IMMUNE GLOBULIN 1500 UNIT/2ML IJ SOSY
300.0000 ug | PREFILLED_SYRINGE | Freq: Once | INTRAMUSCULAR | Status: AC
Start: 2015-05-23 — End: 2015-05-24
  Administered 2015-05-24: 300 ug via INTRAMUSCULAR
  Filled 2015-05-23: qty 2

## 2015-05-23 MED ORDER — AMMONIA AROMATIC IN INHA
RESPIRATORY_TRACT | Status: AC
  Filled 2015-05-23: qty 10

## 2015-05-23 NOTE — Anesthesia Postprocedure Evaluation (Signed)
  Anesthesia Post-op Note  Patient: Hannah Hudson  Procedure(s) Performed: epidural  Anesthesia type:epidural  Patient location: 336-A  Post pain: Pain level controlled  Post assessment: Post-op Vital signs reviewed, Patient's Cardiovascular Status Stable, Respiratory Function Stable, Patent Airway and No signs of Nausea or vomiting  Post vital signs: Reviewed and stable  Last Vitals:  Filed Vitals:   05/23/15 0726  BP: 131/80  Pulse: 80  Temp: 37.1 C  Resp: 20    Level of consciousness: awake, alert  and patient cooperative  Complications: No apparent anesthesia complications

## 2015-05-23 NOTE — Progress Notes (Signed)
Post Partum Day 1 Subjective: Positional HA's  Objective: Blood pressure 129/75, pulse 77, temperature 98.7 F (37.1 C), temperature source Oral, resp. rate 18, last menstrual period 08/22/2014, SpO2 98 %, unknown if currently breastfeeding.  Physical Exam:  General: alert and cooperative Lochia: appropriate Uterine Fundus: firm Incision: Episiotomy ok DVT Evaluation: No cords or calf tenderness.   Recent Labs  05/21/15 2234 05/23/15 0600  HGB 10.9* 10.0*  HCT 33.6* 30.5*    Assessment/Plan: Plan for discharge tomorrow  Anesthesia consult for possible HA due to wet tap during epidural placement. IVF, Caffeine ongoing intervention.   LOS: 2 days   Alanda Slim Cassidie Veiga 05/23/2015, 1:08 PM

## 2015-05-24 LAB — FETAL SCREEN: FETAL SCREEN: NEGATIVE

## 2015-05-24 MED ORDER — OXYCODONE-ACETAMINOPHEN 5-325 MG PO TABS: 2.0000 | ORAL_TABLET | ORAL | Status: DC | PRN

## 2015-05-24 MED ORDER — DOCUSATE SODIUM 100 MG PO CAPS: 100.0000 mg | ORAL_CAPSULE | Freq: Two times a day (BID) | ORAL | Status: DC

## 2015-05-24 MED ORDER — IBUPROFEN 800 MG PO TABS: 800.0000 mg | ORAL_TABLET | Freq: Three times a day (TID) | ORAL | Status: DC

## 2015-05-24 MED ORDER — FERROUS SULFATE 325 (65 FE) MG PO TABS: 325.0000 mg | ORAL_TABLET | Freq: Two times a day (BID) | ORAL | Status: DC

## 2015-05-24 NOTE — Progress Notes (Signed)
Discharge instructions reviewed with patient. Pt v/u of all instructions. Prescription given to pt. Pt to room in with infant in room 336 until he is discharged.

## 2015-05-24 NOTE — Discharge Summary (Signed)
Obstetric Discharge Summary Reason for Admission: induction of labor Prenatal Procedures: NST and ultrasound Intrapartum Procedures: spontaneous vaginal delivery and episiotomy Midline Postpartum Procedures: none Complications-Operative and Postpartum: spinal HA HEMOGLOBIN  Date Value Ref Range Status  05/23/2015 10.0* 12.0 - 16.0 g/dL Final  03/05/2015 12.0 g/dL Final   HGB  Date Value Ref Range Status  05/01/2013 12.4 12.0-16.0 g/dL Final   HCT  Date Value Ref Range Status  05/23/2015 30.5* 35.0 - 47.0 % Final  03/05/2015 35 % Final  05/02/2013 31.6* 35.0-47.0 % Final    Physical Exam:  General: alert and cooperative Lochia: appropriate Uterine Fundus: firm Incision: healing well DVT Evaluation: No evidence of DVT seen on physical exam. Negative Homan's sign.  Discharge Diagnoses: Term Pregnancy-delivered  Discharge Information: Date: 05/24/2015 Activity: unrestricted Diet: routine Medications: PNV, Ibuprofen, Colace, Iron and Percocet Condition: stable Instructions: refer to practice specific booklet Discharge to: home Follow-up Information    Follow up with Brayton Mars, MD In 6 weeks.   Specialties:  Obstetrics and Gynecology, Radiology   Why:  6 week Post Partum Check   Contact information:   [place] [address] [phone]       Newborn Data: Live born female  Birth Weight: 9 lb 1.9 oz (4135 g) APGAR: 8, 9  Home with mother.  Hannah Hudson 05/24/2015, 2:14 PM

## 2015-05-24 NOTE — Anesthesia Postprocedure Evaluation (Signed)
  Anesthesia Post-op Note  Patient: Hannah Hudson  Procedure(s) Performed: Post labor epidural  Anesthesia type:Epidural  Patient location: mother/baby unit 336  Post pain: Pain level controlled  Post assessment: Post-op Vital signs reviewed, Patient's Cardiovascular Status Stable, Respiratory Function Stable, Patent Airway and No signs of Nausea or vomiting.  Pt had a headache on 05/23/15.  This was a follow up this am.  Pt stated she felt much better and headache was gone.  Pt was sitting up in bed getting vital signs taken which were normal.    Post vital signs: Reviewed and stable  Last Vitals:  Filed Vitals:   05/24/15 0733  BP: 136/91  Pulse: 87  Temp: 36.4 C  Resp: 18    Level of consciousness: awake, alert  and patient cooperative  Complications: No apparent anesthesia complications

## 2015-05-26 LAB — RHOGAM INJECTION: UNIT DIVISION: 0

## 2015-05-27 ENCOUNTER — Encounter: Payer: Self-pay | Admitting: Obstetrics and Gynecology

## 2015-05-27 ENCOUNTER — Telehealth: Payer: Self-pay | Admitting: Obstetrics and Gynecology

## 2015-05-27 NOTE — Telephone Encounter (Signed)
Pt states the headaches began in the hospital. Percocet and IBP have been prescribed but since she had been home they had gotten much better until today.  She has been lying flat but does have to get up and tend to baby. Has started taking Percocet again and is unsure whether it is helping at this point. No n/v.  Pt states while she was in the hospital they told her she may need a blood patch. Pt. Advised to take pain medication as directed and appt for tomorrow at 9am made. I called pt back to check to see if pt was drinking caffeine and she said she was. Has a [employer] this am and now drinking a coke. Unsure if helping, pt states maybe a little.

## 2015-05-27 NOTE — Telephone Encounter (Signed)
Pt called and when she had the baby the anesthesiologist went to far with the epidural and she had a spinal fluid leak, she has been laying flat, but when she stands that when she gets the bad headache, but she has to get up and do things for the baby, so she is unsure what she needs to do since it hasn't gotten any better from when she left the hospital. Pt would like a call back.

## 2015-05-28 ENCOUNTER — Inpatient Hospital Stay: Admission: RE | Admit: 2015-05-28 | Payer: BLUE CROSS/BLUE SHIELD | Source: Ambulatory Visit

## 2015-05-28 ENCOUNTER — Ambulatory Visit (INDEPENDENT_AMBULATORY_CARE_PROVIDER_SITE_OTHER): Payer: BLUE CROSS/BLUE SHIELD | Admitting: Obstetrics and Gynecology

## 2015-05-28 ENCOUNTER — Encounter: Payer: Self-pay | Admitting: Obstetrics and Gynecology

## 2015-05-28 DIAGNOSIS — G971 Other reaction to spinal and lumbar puncture: Secondary | ICD-10-CM | POA: Diagnosis not present

## 2015-05-28 NOTE — Patient Instructions (Signed)
1.  Patient to go to preadmission testing for further evaluation

## 2015-05-28 NOTE — Progress Notes (Signed)
Patient ID: Hannah Hudson, female   DOB: 01-Dec-1980, 34 y.o.   MRN: [id number]   OB/GYN CONFERENCE NOTE:  Subjective:       Hannah Hudson is a 34 y.o. G47P3010 female who presents for a conference appointment. Current complaints include:  1.  Postural headache    H/a d/t epidural  Worse since she has been at home-  S/p svd 05/22/2015 Percocet and advil - pain when she stands in a 6-7 Lying down no pain  Gynecologic History No LMP recorded. Postpartum  Obstetric History OB History  Gravida Para Term Preterm AB SAB TAB Ectopic Multiple Living  4 3 3  0 1 1 0 0 0 0    # Outcome Date GA Lbr Len/2nd Weight Sex Delivery Anes PTL Lv  4 Term 05/22/15 102w0d / 00:09 9 lb 1.9 oz (4.135 kg) M Vag-Spont EPI    3 SAB           2 Term           1 Term               Past Medical History  Diagnosis Date  . PROM (premature rupture of membranes) 03/21/2015    returning neg; nitrazine ?, betamehasone 12.5mg  IM q 24h x2, afi tomorrow and weekly afi 03/22/2015-13.5cm  . Hx of preeclampsia, prior pregnancy, currently pregnant 2008    11/14/2014- plan monitory bp, serial u/s,third trimester antepartum testing, baseline serum creatinite  . Prior fetal macrosomia, antepartum     limit weight gain 10-15pounds, early glucola  . Fetal size inconsistent with dates     03/05/2015- growth scan today 86th percentile growth  . Morbid obesity with BMI of 40.0-44.9, adult   . Elevated glucose tolerance test     3hr wnl  . Urinary tract infection, recurrent     11/14/2014- monthly urine cultures    Past Surgical History  Procedure Laterality Date  . Appendectomy  2010    laparoscopic  . Laparoscopic bilateral salpingectomy  2010    bilateral paratubal cyst excision  . Cholecystectomy  2007    Current Outpatient Prescriptions on File Prior to Visit  Medication Sig Dispense Refill  . docusate sodium (COLACE) 100 MG capsule Take 1 capsule (100 mg total) by mouth 2 (two) times daily. 10 capsule 0  .  ferrous sulfate 325 (65 FE) MG tablet Take 1 tablet (325 mg total) by mouth 2 (two) times daily with a meal. 60 tablet 0  . ibuprofen (ADVIL,MOTRIN) 800 MG tablet Take 1 tablet (800 mg total) by mouth 3 (three) times daily. 30 tablet 0  . oxyCODONE-acetaminophen (PERCOCET/ROXICET) 5-325 MG per tablet Take 2 tablets by mouth every 4 (four) hours as needed (for pain scale greater than 7). 30 tablet 0  . oxymetazoline (AFRIN) 0.05 % nasal spray Place 1 spray into both nostrils 2 (two) times daily as needed for congestion.    . Prenatal Vit-Fe Fumarate-FA (PRENATAL MULTIVITAMIN) TABS tablet Take 1 tablet by mouth daily at 12 noon.     No current facility-administered medications on file prior to visit.    No Known Allergies  History   Social History  . Marital Status: Married    Spouse Name: N/A  . Number of Children: N/A  . Years of Education: N/A   Occupational History  . buisness Freight forwarder    Social History Main Topics  . Smoking status: Never Smoker   . Smokeless tobacco: Never Used  . Alcohol Use: No  .  Drug Use: No  . Sexual Activity:    Partners: Male   Other Topics Concern  . Not on file   Social History Narrative    Family History  Problem Relation Age of Onset  . Bone cancer Sister   . Hypertension Mother   . Hypertension Father   . Diabetes Mother   . Diabetes Father   . Rheum arthritis Mother     The following portions of the patient's history were reviewed and updated as appropriate: allergies, current medications, past family history, past medical history, past social history, past surgical history and problem list.  Review of Systems See HPI   Objective:   BP 139/87 mmHg  Pulse 93  Ht 5\' 6"  (4.709 m)  Wt 247 lb 12.8 oz (112.401 kg)  BMI 40.01 kg/m2   Assessment/Plan:   1. Spinal HA    Referral to Anesthesia-today   Brayton Mars, MD [facility]

## 2015-05-31 ENCOUNTER — Telehealth: Payer: Self-pay | Admitting: Obstetrics and Gynecology

## 2015-05-31 ENCOUNTER — Encounter: Payer: Self-pay | Admitting: Obstetrics and Gynecology

## 2015-05-31 ENCOUNTER — Ambulatory Visit (INDEPENDENT_AMBULATORY_CARE_PROVIDER_SITE_OTHER): Payer: BLUE CROSS/BLUE SHIELD | Admitting: Obstetrics and Gynecology

## 2015-05-31 VITALS — BP 135/80 | HR 102 | Temp 98.2°F | Ht 66.0 in | Wt 242.4 lb

## 2015-05-31 DIAGNOSIS — N61 Inflammatory disorders of breast: Secondary | ICD-10-CM | POA: Diagnosis not present

## 2015-05-31 MED ORDER — DICLOXACILLIN SODIUM 500 MG PO CAPS: 500.0000 mg | ORAL_CAPSULE | Freq: Four times a day (QID) | ORAL | Status: DC

## 2015-05-31 NOTE — Telephone Encounter (Signed)
Hannah Hudson CALLED AND HAS RED STREAKS ON HER BREAST AND WAS TOLD BY LACTATION CONSULTANT TO BE SEEN BY HER OB.

## 2015-05-31 NOTE — Telephone Encounter (Signed)
Red streaks on right breast noted yesterday. No fever or chills. Some pain when feeding due to dry cracked nipple and bleeding from nipplept  assosiated with this Unable to tell if skin warm due to gel pack but thinks it is not. Pt to come in at 3:00pm for an appt to see Dr D.

## 2015-05-31 NOTE — Progress Notes (Signed)
Patient ID: Hannah Hudson, female   DOB: [birth year], 34 y.o.   MRN: [id number]   Chief complaint: 1. Breast infection  Patient is postpartum and breast-feeding. She has noted changes in her right breast including the following:  Red streaks on outside of nipples - rt breast No fever,chills Nipple d/c - none- only cracked and bleeding Minimal tenderness is noted only during breast-feeding  OBJECTIVE: BP 135/80 mmHg  Pulse 102  Temp(Src) 98.2 F (36.8 C)  Ht 5\' 6"  (1.676 m)  Wt 242 lb 6.4 oz (109.952 kg)  BMI 39.14 kg/m2  Breastfeeding? Yes  Patient is a pleasant well-appearing white female in no acute distress. She is alert and oriented. Neck supple without thyromegaly or adenopathy Breasts: Left breast-lactation changes; no dominant masses adenopathy or nipple discharge Right breast-peri-areola are erythema; no fluctuance; no ulceration; no induration; minimal tenderness Bilateral nipples are unremarkable.  IMPRESSION: 1. Early mastoiditis, right breast  PLAN: 1. Dicloxacillin 500 mg 4 times a day 14 days 2. Increase by mouth fluid intake 3. Tylenol and Motrin as needed 4. Return in 2 weeks for follow-up

## 2015-05-31 NOTE — Patient Instructions (Signed)
1. Dicloxacillin 500 mg 4 times a day 14 days  2. Increase by mouth fluid intake  3. Tylenol and Motrin as needed  4. Return in 2 weeks for follow-up

## 2015-06-13 ENCOUNTER — Encounter: Payer: Self-pay | Admitting: Obstetrics and Gynecology

## 2015-06-13 ENCOUNTER — Ambulatory Visit (INDEPENDENT_AMBULATORY_CARE_PROVIDER_SITE_OTHER): Payer: BLUE CROSS/BLUE SHIELD | Admitting: Obstetrics and Gynecology

## 2015-06-13 VITALS — BP 117/79 | HR 76 | Temp 97.9°F | Ht 66.0 in | Wt 244.3 lb

## 2015-06-13 DIAGNOSIS — O9122 Nonpurulent mastitis associated with the puerperium: Secondary | ICD-10-CM

## 2015-06-13 NOTE — Progress Notes (Signed)
GYNECOLOGY PROGRESS NOTE  Subjective:    Patient ID: Hannah Hudson, female    DOB: 10/09/1981, 34 y.o.   MRN: [id number]  HPI  Patient is a 34 y.o. G40P3010 female who presents for 2 week f/u of postpartum mastitis. Has completed antibiotics. Denies complaints.  Notes that mastitis has resolved.    The following portions of the patient's history were reviewed and updated as appropriate: allergies, current medications, past family history, past medical history, past social history, past surgical history and problem list.  Review of Systems A comprehensive review of systems was negative.   Objective:   Blood pressure 117/79, pulse 76, temperature 97.9 F (36.6 C), height 5\' 6"  (1.676 m), weight 244 lb 5 oz (110.819 kg), currently breastfeeding. General appearance: alert and no distress  Breasts: normal appearance, no masses or tenderness, negative findings: palpation negative for masses or nodules, no erythema or warmth Abdomen: soft, non-tender; bowel sounds normal; no masses,  no organomegaly  Assessment:   Postpartum mastitis, resolved  Plan:   Patient with no further symptoms, no difficulty with breastfeeding.   RTC in 3 weeks for postpartu visit with Dr. Enzo Bi.    Rubie Maid, MD [facility] [id number] [facility]

## 2015-06-17 ENCOUNTER — Other Ambulatory Visit: Payer: Self-pay | Admitting: Obstetrics and Gynecology

## 2015-07-04 ENCOUNTER — Ambulatory Visit (INDEPENDENT_AMBULATORY_CARE_PROVIDER_SITE_OTHER): Payer: BLUE CROSS/BLUE SHIELD | Admitting: Obstetrics and Gynecology

## 2015-07-04 ENCOUNTER — Encounter: Payer: Self-pay | Admitting: Obstetrics and Gynecology

## 2015-07-04 DIAGNOSIS — Z30011 Encounter for initial prescription of contraceptive pills: Secondary | ICD-10-CM

## 2015-07-04 MED ORDER — NORETHINDRONE 0.35 MG PO TABS: 1.0000 | ORAL_TABLET | Freq: Every day | ORAL | Status: DC

## 2015-07-04 NOTE — Patient Instructions (Signed)
1.  Resume all activities without restriction. 2.  Begin Micronor 1 pill daily for contraception. 3.  Return in 6 months for annual exam.

## 2015-07-04 NOTE — Progress Notes (Signed)
Patient ID: Hannah Hudson, female   DOB: 1981-08-28, 34 y.o.   MRN: [id number] 6 week PP svd 05/22/2015- female 9.2lb  No IC since delivery Breastfeeding no issues- s/p mastitis (7/27/20016)with dicloxacillin Contraception- wants to know her options  Chief complaint: 1.  Six-week postpartum check6 week postpartum check  The patient presents today for her 6 week postpartum check following spontaneous vaginal delivery of a 9 lbs. 2 oz. Baby boy on 05/22/2015.  She is breast-feeding; she is status post mastitis therapy without complication.  Patient is not having any postpartum blues or depression. Contraception options were reviewed; patient desires to go on minipill until husband has vasectomy.  Past medical history, past surgical history, problem list, medications, and allergies were reviewed and updated.  Review of systems: Per HPI.  OBJECTIVE:Blood pressure 119/84, pulse 81, height 5\' 6"  (1.676 m), weight 244 lb 9.6 oz (110.95 kg), currently breastfeeding. Patient is a pleasant, well-appearing white female in no acute distress.  She is alert and oriented. Neck: Supple without thyromegaly or adenopathy. Breasts: Lactation changes noted; no evidence of mastitis. Abdomen: Soft, nontender, without organomegaly. Pelvic exam: External genitalia-normal BUS-normal. Vagina-normal Cervix-no lesions; no cervical motion tenderness Uterus-normal size, shape and consistency, mobile, nontender Adnexa-nonpalpable and nontender. Rectovaginal exam-external exam normal; sphincter tone normal; no rectal masses. Extremities: Without clubbing, cyanosis or edema.  IMPRESSION: 1.  Normal 6 week postpartum check. 2.  Status post sponges, vaginal delivery; pregnancy, located by fetal macrosomia with normal 3 hour gtt. 3.  Increased body mass index. 4.  Desires hormonal contraception.  PLAN: 1.  Resume all activities without restriction. 2.  Begin Micronor minipill. 3.  Return in 6 months for Annual exam  or when necessary.

## 2016-01-02 ENCOUNTER — Encounter: Payer: BLUE CROSS/BLUE SHIELD | Admitting: Obstetrics and Gynecology

## 2016-11-17 DIAGNOSIS — J312 Chronic pharyngitis: Secondary | ICD-10-CM | POA: Insufficient documentation

## 2017-11-18 DIAGNOSIS — E785 Hyperlipidemia, unspecified: Secondary | ICD-10-CM | POA: Insufficient documentation

## 2018-06-01 ENCOUNTER — Ambulatory Visit (INDEPENDENT_AMBULATORY_CARE_PROVIDER_SITE_OTHER): Payer: 59 | Admitting: Obstetrics and Gynecology

## 2018-06-01 ENCOUNTER — Other Ambulatory Visit (HOSPITAL_COMMUNITY)
Admission: RE | Admit: 2018-06-01 | Discharge: 2018-06-01 | Disposition: A | Discharge status: Home / Self Care | Payer: 59 | Source: Ambulatory Visit | Attending: Obstetrics and Gynecology | Admitting: Obstetrics and Gynecology

## 2018-06-01 ENCOUNTER — Encounter: Payer: Self-pay | Admitting: Obstetrics and Gynecology

## 2018-06-01 VITALS — BP 122/81 | HR 76 | Ht 66.0 in | Wt 194.0 lb

## 2018-06-01 DIAGNOSIS — Z124 Encounter for screening for malignant neoplasm of cervix: Secondary | ICD-10-CM | POA: Diagnosis not present

## 2018-06-01 DIAGNOSIS — N644 Mastodynia: Secondary | ICD-10-CM | POA: Diagnosis not present

## 2018-06-01 DIAGNOSIS — Z01419 Encounter for gynecological examination (general) (routine) without abnormal findings: Secondary | ICD-10-CM | POA: Insufficient documentation

## 2018-06-01 DIAGNOSIS — Z6831 Body mass index (BMI) 31.0-31.9, adult: Secondary | ICD-10-CM | POA: Diagnosis not present

## 2018-06-01 LAB — POCT URINALYSIS DIPSTICK
Bilirubin, UA: NEGATIVE
Blood, UA: NEGATIVE
Glucose, UA: NEGATIVE
KETONES UA: 15
Leukocytes, UA: NEGATIVE
Nitrite, UA: NEGATIVE
Protein, UA: POSITIVE — AB
Spec Grav, UA: 1.015 (ref 1.010–1.025)
UROBILINOGEN UA: NEGATIVE U/dL — AB
pH, UA: 6.5 (ref 5.0–8.0)

## 2018-06-01 NOTE — Addendum Note (Signed)
Addended by: Elmer Picker M on: 06/01/2018 11:28 AM   Modules accepted: Orders

## 2018-06-01 NOTE — Progress Notes (Signed)
Pt has slight concern about soreness of breasts  ANNUAL PREVENTATIVE CARE GYN  ENCOUNTER NOTE  Subjective:       Hannah Hudson is a 37 y.o. G63P3010 female here for a routine annual gynecologic exam.  Current complaints: 1.  Breast tenderness  Patient has not had gynecology follow-up in the past 3 years.  She is seeing Dr. Caryl Comes in internal medicine.  He has been following hyperlipidemia that is thought to be possibly related to her keto diet.  Over the past year she has lost 67 pounds doing the keto diet.  Goal is for approximately another 30 pound weight loss.  Menstrual cycles are regular every 24 to 28 days with 5 days of duration of flow.  She is not experiencing any significant dysmenorrhea or intermenstrual bleeding.  Contraception is vasectomy.  Bowel function is normal.  No Bladder function is normal.   Gynecologic History Patient's last menstrual period was 05/24/2018 (exact date). Contraception: vasectomy Last Pap: Normal-date uncertain Last mammogram: None  Obstetric History OB History  Gravida Para Term Preterm AB Living  4 3 3  0 1 3  SAB TAB Ectopic Multiple Live Births  1 0 0 0      # Outcome Date GA Lbr Len/2nd Weight Sex Delivery Anes PTL Lv  4 Term 05/22/15 [redacted]w[redacted]d / 00:09 9 lb 1.9 oz (4.135 kg) M Vag-Spont EPI    3 SAB           2 Term           1 Term             Past Medical History:  Diagnosis Date  . Elevated glucose tolerance test    3hr wnl  . Fetal size inconsistent with dates    03/05/2015- growth scan today 86th percentile growth  . Hx of preeclampsia, prior pregnancy, currently pregnant 2008   11/14/2014- plan monitory bp, serial u/s,third trimester antepartum testing, baseline serum creatinite  . Morbid obesity with BMI of 40.0-44.9, adult   . Prior fetal macrosomia, antepartum    limit weight gain 10-15pounds, early glucola  . PROM (premature rupture of membranes) 03/21/2015   returning neg; nitrazine ?, betamehasone 12.5mg  IM q 24h x2, afi  tomorrow and weekly afi 03/22/2015-13.5cm  . Urinary tract infection, recurrent    11/14/2014- monthly urine cultures    Past Surgical History:  Procedure Laterality Date  . APPENDECTOMY  2010   laparoscopic  . CHOLECYSTECTOMY  2007  . LAPAROSCOPIC BILATERAL SALPINGECTOMY  2010   bilateral paratubal cyst excision    Current Outpatient Medications on File Prior to Visit  Medication Sig Dispense Refill  . norethindrone (MICRONOR,CAMILA,ERRIN) 0.35 MG tablet Take 1 tablet (0.35 mg total) by mouth daily. (Patient not taking: Reported on 06/01/2018) 1 Package 11  . Prenatal Vit-Fe Fumarate-FA (PRENATAL MULTIVITAMIN) TABS tablet Take 1 tablet by mouth daily at 12 noon.     No current facility-administered medications on file prior to visit.     No Known Allergies  Social History   Socioeconomic History  . Marital status: Married    Spouse name: Not on file  . Number of children: Not on file  . Years of education: Not on file  . Highest education level: Not on file  Occupational History  . Occupation: Academic librarian  Social Needs  . Financial resource strain: Not on file  . Food insecurity:    Worry: Not on file    Inability: Not on file  . Transportation  needs:    Medical: Not on file    Non-medical: Not on file  Tobacco Use  . Smoking status: Never Smoker  . Smokeless tobacco: Never Used  Substance and Sexual Activity  . Alcohol use: No    Alcohol/week: 0.0 oz  . Drug use: No  . Sexual activity: Yes    Partners: Male  Lifestyle  . Physical activity:    Days per week: Not on file    Minutes per session: Not on file  . Stress: Not on file  Relationships  . Social connections:    Talks on phone: Not on file    Gets together: Not on file    Attends religious service: Not on file    Active member of club or organization: Not on file    Attends meetings of clubs or organizations: Not on file    Relationship status: Not on file  . Intimate partner violence:    Fear  of current or ex partner: Not on file    Emotionally abused: Not on file    Physically abused: Not on file    Forced sexual activity: Not on file  Other Topics Concern  . Not on file  Social History Narrative  . Not on file    Family History  Problem Relation Age of Onset  . Bone cancer Sister   . Hypertension Mother   . Hypertension Father   . Diabetes Mother   . Diabetes Father   . Rheum arthritis Mother     The following portions of the patient's history were reviewed and updated as appropriate: allergies, current medications, past family history, past medical history, past social history, past surgical history and problem list.  Review of Systems Review of Systems  Constitutional: Negative.   HENT: Negative.   Eyes: Negative.   Respiratory: Negative.   Cardiovascular: Negative.   Gastrointestinal: Negative.   Genitourinary: Negative.   Musculoskeletal:       Breast tenderness bilateral, possibly cyclic  Skin: Negative.   Neurological: Negative.   Endo/Heme/Allergies: Negative.   Psychiatric/Behavioral: Negative.       Objective:   BP 122/81   Pulse 76   Ht 5\' 6"  (1.676 m)   Wt 194 lb (88 kg)   LMP 05/24/2018 (Exact Date)   BMI 31.31 kg/m  CONSTITUTIONAL: Well-developed, well-nourished female in no acute distress.  PSYCHIATRIC: Normal mood and affect. Normal behavior. Normal judgment and thought content. [place]: Alert and oriented to person, place, and time. Normal muscle tone coordination. No cranial nerve deficit noted. HENT:  Normocephalic, atraumatic, External right and left ear normal.  EYES: Conjunctivae and EOM are normal. No scleral icterus.  NECK: Normal range of motion, supple, no masses.  Normal thyroid.  SKIN: Skin is warm and dry. No rash noted. Not diaphoretic. No erythema. No pallor. CARDIOVASCULAR: Normal heart rate noted, regular rhythm, no murmur. RESPIRATORY: Clear to auscultation bilaterally. Effort and breath sounds normal, no  problems with respiration noted. BREASTS: Symmetric in size. No masses, skin changes, nipple drainage, or lymphadenopathy. ABDOMEN: Soft, normal bowel sounds, no distention noted.  No tenderness, rebound or guarding.  BLADDER: Normal PELVIC:  External Genitalia: Normal  BUS: Normal  Vagina: Normal estrogen effect  Cervix: Normal; parous, mobile, nontender, no significant discharge  Uterus: Normal; mobile, midplane, top normal size and shape, nontender  Adnexa: Normal; nonpalpable nontender  RV: External Exam NormaI  MUSCULOSKELETAL: Normal range of motion. No tenderness.  No cyanosis, clubbing, or edema.  2+ distal pulses. LYMPHATIC:  No Axillary, Supraclavicular, or Inguinal Adenopathy.    Assessment:   Annual gynecologic examination 37 y.o. Contraception: vasectomy BMI 31.1 and Obesity 1 Problem List Items Addressed This Visit    None    Visit Diagnoses    Encounter for well woman exam with routine gynecological exam    -  Primary    Breast tenderness; normal exam  Plan:  Pap: Pap Co Test Mammogram: Not Ordered and Not Indicated Stool Guaiac Testing:  Not Indicated Labs: Done through primary care Routine preventative health maintenance measures emphasized: Exercise/Diet/Weight control, Tobacco Warnings and Alcohol/Substance use risks Contraception-vasectomy Decrease caffeine and chocolate intake Maintain breast tenderness diary to check for cyclic nature of discomfort Return to [place], [place]   Note: This dictation was prepared with Dragon dictation along with smaller phrase technology. Any transcriptional errors that result from this process are unintentional.

## 2018-06-01 NOTE — Patient Instructions (Signed)
1.  Pap smear is done. 2.  Self breast awareness is encouraged 3.  Decrease caffeine intake and chocolate intake to help with breast tenderness 4.  Monitor symptoms of breast tenderness to see if they are cyclic in nature 5.  Screening labs are done through primary care 6.  Continue with healthy eating, exercise, and control weight loss 7.  Contraception-vasectomy 8.  Return in 1 year for annual exam  Health Maintenance, Female Adopting a healthy lifestyle and getting preventive care can go a long way to promote health and wellness. Talk with your health care provider about what schedule of regular examinations is right for you. This is a good chance for you to check in with your provider about disease prevention and staying healthy. In between checkups, there are plenty of things you can do on your own. Experts have done a lot of research about which lifestyle changes and preventive measures are most likely to keep you healthy. Ask your health care provider for more information. Weight and diet Eat a healthy diet  Be sure to include plenty of vegetables, fruits, low-fat dairy products, and lean protein.  Do not eat a lot of foods high in solid fats, added sugars, or salt.  Get regular exercise. This is one of the most important things you can do for your health. ? Most adults should exercise for at least 150 minutes each week. The exercise should increase your heart rate and make you sweat (moderate-intensity exercise). ? Most adults should also do strengthening exercises at least twice a week. This is in addition to the moderate-intensity exercise.  Maintain a healthy weight  Body mass index (BMI) is a measurement that can be used to identify possible weight problems. It estimates body fat based on height and weight. Your health care provider can help determine your BMI and help you achieve or maintain a healthy weight.  For females 33 years of age and older: ? A BMI below 18.5 is  considered underweight. ? A BMI of 18.5 to 24.9 is normal. ? A BMI of 25 to 29.9 is considered overweight. ? A BMI of 30 and above is considered obese.  Watch levels of cholesterol and blood lipids  You should start having your blood tested for lipids and cholesterol at 37 years of age, then have this test every 5 years.  You may need to have your cholesterol levels checked more often if: ? Your lipid or cholesterol levels are high. ? You are older than 37 years of age. ? You are at high risk for heart disease.  Cancer screening Lung Cancer  Lung cancer screening is recommended for adults 78-44 years old who are at high risk for lung cancer because of a history of smoking.  A yearly low-dose CT scan of the lungs is recommended for people who: ? Currently smoke. ? Have quit within the past 15 years. ? Have at least a 30-pack-year history of smoking. A pack year is smoking an average of one pack of cigarettes a day for 1 year.  Yearly screening should continue until it has been 15 years since you quit.  Yearly screening should stop if you develop a health problem that would prevent you from having lung cancer treatment.  Breast Cancer  Practice breast self-awareness. This means understanding how your breasts normally appear and feel.  It also means doing regular breast self-exams. Let your health care provider know about any changes, no matter how small.  If you are in  your 20s or 30s, you should have a clinical breast exam (CBE) by a health care provider every 1-3 years as part of a regular health exam.  If you are 41 or older, have a CBE every year. Also consider having a breast X-ray (mammogram) every year.  If you have a family history of breast cancer, talk to your health care provider about genetic screening.  If you are at high risk for breast cancer, talk to your health care provider about having an MRI and a mammogram every year.  Breast cancer gene (BRCA) assessment  is recommended for women who have family members with BRCA-related cancers. BRCA-related cancers include: ? Breast. ? Ovarian. ? Tubal. ? Peritoneal cancers.  Results of the assessment will determine the need for genetic counseling and BRCA1 and BRCA2 testing.  Cervical Cancer Your health care provider may recommend that you be screened regularly for cancer of the pelvic organs (ovaries, uterus, and vagina). This screening involves a pelvic examination, including checking for microscopic changes to the surface of your cervix (Pap test). You may be encouraged to have this screening done every 3 years, beginning at age 38.  For women ages 73-65, health care providers may recommend pelvic exams and Pap testing every 3 years, or they may recommend the Pap and pelvic exam, combined with testing for human papilloma virus (HPV), every 5 years. Some types of HPV increase your risk of cervical cancer. Testing for HPV may also be done on women of any age with unclear Pap test results.  Other health care providers may not recommend any screening for nonpregnant women who are considered low risk for pelvic cancer and who do not have symptoms. Ask your health care provider if a screening pelvic exam is right for you.  If you have had past treatment for cervical cancer or a condition that could lead to cancer, you need Pap tests and screening for cancer for at least 20 years after your treatment. If Pap tests have been discontinued, your risk factors (such as having a new sexual partner) need to be reassessed to determine if screening should resume. Some women have medical problems that increase the chance of getting cervical cancer. In these cases, your health care provider may recommend more frequent screening and Pap tests.  Colorectal Cancer  This type of cancer can be detected and often prevented.  Routine colorectal cancer screening usually begins at 37 years of age and continues through 37 years of  age.  Your health care provider may recommend screening at an earlier age if you have risk factors for colon cancer.  Your health care provider may also recommend using home test kits to check for hidden blood in the stool.  A small camera at the end of a tube can be used to examine your colon directly (sigmoidoscopy or colonoscopy). This is done to check for the earliest forms of colorectal cancer.  Routine screening usually begins at age 71.  Direct examination of the colon should be repeated every 5-10 years through 37 years of age. However, you may need to be screened more often if early forms of precancerous polyps or small growths are found.  Skin Cancer  Check your skin from head to toe regularly.  Tell your health care provider about any new moles or changes in moles, especially if there is a change in a mole's shape or color.  Also tell your health care provider if you have a mole that is larger than the size  of a pencil eraser.  Always use sunscreen. Apply sunscreen liberally and repeatedly throughout the day.  Protect yourself by wearing long sleeves, pants, a wide-brimmed hat, and sunglasses whenever you are outside.  Heart disease, diabetes, and high blood pressure  High blood pressure causes heart disease and increases the risk of stroke. High blood pressure is more likely to develop in: ? People who have blood pressure in the high end of the normal range (130-139/85-89 mm Hg). ? People who are overweight or obese. ? People who are African American.  If you are 52-60 years of age, have your blood pressure checked every 3-5 years. If you are 21 years of age or older, have your blood pressure checked every year. You should have your blood pressure measured twice-once when you are at a hospital or clinic, and once when you are not at a hospital or clinic. Record the average of the two measurements. To check your blood pressure when you are not at a hospital or clinic, you  can use: ? An automated blood pressure machine at a pharmacy. ? A home blood pressure monitor.  If you are between 1 years and 13 years old, ask your health care provider if you should take aspirin to prevent strokes.  Have regular diabetes screenings. This involves taking a blood sample to check your fasting blood sugar level. ? If you are at a normal weight and have a low risk for diabetes, have this test once every three years after 36 years of age. ? If you are overweight and have a high risk for diabetes, consider being tested at a younger age or more often. Preventing infection Hepatitis B  If you have a higher risk for hepatitis B, you should be screened for this virus. You are considered at high risk for hepatitis B if: ? You were born in a country where hepatitis B is common. Ask your health care provider which countries are considered high risk. ? Your parents were born in a high-risk country, and you have not been immunized against hepatitis B (hepatitis B vaccine). ? You have HIV or AIDS. ? You use needles to inject street drugs. ? You live with someone who has hepatitis B. ? You have had sex with someone who has hepatitis B. ? You get hemodialysis treatment. ? You take certain medicines for conditions, including cancer, organ transplantation, and autoimmune conditions.  Hepatitis C  Blood testing is recommended for: ? Everyone born from [birth year] through 1965. ? Anyone with known risk factors for hepatitis C.  Sexually transmitted infections (STIs)  You should be screened for sexually transmitted infections (STIs) including gonorrhea and chlamydia if: ? You are sexually active and are younger than 37 years of age. ? You are older than 37 years of age and your health care provider tells you that you are at risk for this type of infection. ? Your sexual activity has changed since you were last screened and you are at an increased risk for chlamydia or gonorrhea. Ask your  health care provider if you are at risk.  If you do not have HIV, but are at risk, it may be recommended that you take a prescription medicine daily to prevent HIV infection. This is called pre-exposure prophylaxis (PrEP). You are considered at risk if: ? You are sexually active and do not regularly use condoms or know the HIV status of your partner(s). ? You take drugs by injection. ? You are sexually active with a partner who  has HIV.  Talk with your health care provider about whether you are at high risk of being infected with HIV. If you choose to begin PrEP, you should first be tested for HIV. You should then be tested every 3 months for as long as you are taking PrEP. Pregnancy  If you are premenopausal and you may become pregnant, ask your health care provider about preconception counseling.  If you may become pregnant, take 400 to 800 micrograms (mcg) of folic acid every day.  If you want to prevent pregnancy, talk to your health care provider about birth control (contraception). Osteoporosis and menopause  Osteoporosis is a disease in which the bones lose minerals and strength with aging. This can result in serious bone fractures. Your risk for osteoporosis can be identified using a bone density scan.  If you are 26 years of age or older, or if you are at risk for osteoporosis and fractures, ask your health care provider if you should be screened.  Ask your health care provider whether you should take a calcium or vitamin D supplement to lower your risk for osteoporosis.  Menopause may have certain physical symptoms and risks.  Hormone replacement therapy may reduce some of these symptoms and risks. Talk to your health care provider about whether hormone replacement therapy is right for you. Follow these instructions at home:  Schedule regular health, dental, and eye exams.  Stay current with your immunizations.  Do not use any tobacco products including cigarettes, chewing  tobacco, or electronic cigarettes.  If you are pregnant, do not drink alcohol.  If you are breastfeeding, limit how much and how often you drink alcohol.  Limit alcohol intake to no more than 1 drink per day for nonpregnant women. One drink equals 12 ounces of beer, 5 ounces of wine, or 1 ounces of hard liquor.  Do not use street drugs.  Do not share needles.  Ask your health care provider for help if you need support or information about quitting drugs.  Tell your health care provider if you often feel depressed.  Tell your health care provider if you have ever been abused or do not feel safe at home. This information is not intended to replace advice given to you by your health care provider. Make sure you discuss any questions you have with your health care provider. Document Released: 05/11/2011 Document Revised: 04/02/2016 Document Reviewed: 07/30/2015 Elsevier Interactive Patient Education  [employer].

## 2018-06-02 LAB — CYTOLOGY - PAP
Diagnosis: NEGATIVE
HPV (WINDOPATH): NOT DETECTED

## 2019-02-11 ENCOUNTER — Encounter: Payer: Self-pay | Admitting: Emergency Medicine

## 2019-02-11 ENCOUNTER — Emergency Department: Payer: Managed Care, Other (non HMO)

## 2019-02-11 ENCOUNTER — Other Ambulatory Visit: Payer: Self-pay

## 2019-02-11 ENCOUNTER — Emergency Department
Admission: EM | Admit: 2019-02-11 | Discharge: 2019-02-11 | Disposition: A | Discharge status: Home / Self Care | Payer: Managed Care, Other (non HMO) | Attending: Student in an Organized Health Care Education/Training Program | Admitting: Student in an Organized Health Care Education/Training Program

## 2019-02-11 DIAGNOSIS — R000 Tachycardia, unspecified: Secondary | ICD-10-CM | POA: Diagnosis not present

## 2019-02-11 DIAGNOSIS — R05 Cough: Secondary | ICD-10-CM | POA: Diagnosis present

## 2019-02-11 DIAGNOSIS — R002 Palpitations: Secondary | ICD-10-CM | POA: Insufficient documentation

## 2019-02-11 DIAGNOSIS — R0981 Nasal congestion: Secondary | ICD-10-CM

## 2019-02-11 LAB — FIBRIN DERIVATIVES D-DIMER (ARMC ONLY): Fibrin derivatives D-dimer (ARMC): 149.59 ng/mL (FEU) (ref 0.00–499.00)

## 2019-02-11 LAB — COMPREHENSIVE METABOLIC PANEL
ALT: 12 U/L (ref 0–44)
AST: 17 U/L (ref 15–41)
Albumin: 4.3 g/dL (ref 3.5–5.0)
Alkaline Phosphatase: 67 U/L (ref 38–126)
Anion gap: 8 (ref 5–15)
BUN: 9 mg/dL (ref 6–20)
CO2: 25 mmol/L (ref 22–32)
Calcium: 9.1 mg/dL (ref 8.9–10.3)
Chloride: 104 mmol/L (ref 98–111)
Creatinine, Ser: 0.59 mg/dL (ref 0.44–1.00)
GFR calc Af Amer: >60 mL/min (ref >60)
GFR calc non Af Amer: >60 mL/min (ref >60)
Glucose, Bld: 114 mg/dL — ABNORMAL HIGH (ref 70–99)
Potassium: 3.9 mmol/L (ref 3.5–5.1)
Sodium: 137 mmol/L (ref 135–145)
Total Bilirubin: 0.9 mg/dL (ref 0.3–1.2)
Total Protein: 7.6 g/dL (ref 6.5–8.1)

## 2019-02-11 LAB — TROPONIN I: Troponin I: <0.03 ng/mL (ref <0.03)

## 2019-02-11 LAB — T4, FREE: Free T4: 0.98 ng/dL (ref 0.82–1.77)

## 2019-02-11 LAB — CBC WITH DIFFERENTIAL/PLATELET
Abs Immature Granulocytes: 0.03 10*3/uL (ref 0.00–0.07)
Basophils Absolute: 0 10*3/uL (ref 0.0–0.1)
Basophils Relative: 0 %
Eosinophils Absolute: 0 10*3/uL (ref 0.0–0.5)
Eosinophils Relative: 0 %
HCT: 42.7 % (ref 36.0–46.0)
Hemoglobin: 14.5 g/dL (ref 12.0–15.0)
Immature Granulocytes: 0 %
Lymphocytes Relative: 12 %
Lymphs Abs: 1.2 10*3/uL (ref 0.7–4.0)
MCH: 30.1 pg (ref 26.0–34.0)
MCHC: 34 g/dL (ref 30.0–36.0)
MCV: 88.6 fL (ref 80.0–100.0)
Monocytes Absolute: 0.6 10*3/uL (ref 0.1–1.0)
Monocytes Relative: 7 %
Neutro Abs: 7.5 10*3/uL (ref 1.7–7.7)
Neutrophils Relative %: 81 %
Platelets: 239 10*3/uL (ref 150–400)
RBC: 4.82 MIL/uL (ref 3.87–5.11)
RDW: 14.2 % (ref 11.5–15.5)
WBC: 9.4 10*3/uL (ref 4.0–10.5)
nRBC: 0 % (ref 0.0–0.2)

## 2019-02-11 LAB — POCT PREGNANCY, URINE: Preg Test, Ur: NEGATIVE

## 2019-02-11 LAB — TSH: TSH: 2.679 u[IU]/mL (ref 0.350–4.500)

## 2019-02-11 MED ORDER — SODIUM CHLORIDE 0.9 % IV BOLUS
1000.0000 mL | Freq: Once | INTRAVENOUS | Status: AC
  Administered 2019-02-11: 1000 mL via INTRAVENOUS

## 2019-02-11 NOTE — ED Triage Notes (Signed)
Cough and fever x 5 days, feeling of rapid heart rate. Seen at [facility] 4 days ago and tested for covid.

## 2019-02-11 NOTE — ED Provider Notes (Signed)
[facility] Department Provider Note    First MD Initiated Contact with Patient 02/11/19 1206     (approximate)  I have reviewed the triage vital signs and the nursing notes.   HISTORY  Chief Complaint Cough; Fever; and Tachycardia    HPI Hannah Hudson is a 38 y.o. female below listed past medical history presents the ER for several days of cough palpitations and tachycardia.  Denies any productive cough but has had measured fevers at home.  Was recently started on Levaquin by PCP.  She was tested for covid 19 at that office but is still awaiting results.  Denies any known sick contacts.  Denies any chest pain.  Does not feel short of breath but is feeling heart palpitations.  Was previously on beta-blockers for this but stopped it several years ago.  Denies any history of thyroid issues. No N/v/d    Past Medical History:  Diagnosis Date  . Elevated glucose tolerance test    3hr wnl  . Fetal size inconsistent with dates    03/05/2015- growth scan today 86th percentile growth  . Hx of preeclampsia, prior pregnancy, currently pregnant 2008   11/14/2014- plan monitory bp, serial u/s,third trimester antepartum testing, baseline serum creatinite  . Morbid obesity with BMI of 40.0-44.9, adult ([place])   . Prior fetal macrosomia, antepartum    limit weight gain 10-15pounds, early glucola  . PROM (premature rupture of membranes) 03/21/2015   returning neg; nitrazine ?, betamehasone 12.5mg  IM q 24h x2, afi tomorrow and weekly afi 03/22/2015-13.5cm  . Urinary tract infection, recurrent    11/14/2014- monthly urine cultures   Family History  Problem Relation Age of Onset  . Bone cancer Sister   . Hypertension Mother   . Diabetes Mother   . Rheum arthritis Mother   . Hypertension Father   . Diabetes Father    Past Surgical History:  Procedure Laterality Date  . APPENDECTOMY  2010   laparoscopic  . CHOLECYSTECTOMY  2007  . LAPAROSCOPIC BILATERAL  SALPINGECTOMY  2010   bilateral paratubal cyst excision   Patient Active Problem List   Diagnosis Date Noted  . 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 Hyperlipidemia, mild 11/18/2017  . Sore throat, chronic 11/17/2016  . Adult BMI 31.0-31.9 kg/sq m 04/19/2015      Prior to Admission medications   Medication Sig Start Date End Date Taking? Authorizing Provider  Prenatal Vit-Fe Fumarate-FA (PRENATAL MULTIVITAMIN) TABS tablet Take 1 tablet by mouth daily at 12 noon.    [provider]    Allergies Patient has no known allergies.    Social History Social History   Tobacco Use  . Smoking status: Never Smoker  . Smokeless tobacco: Never Used  Substance Use Topics  . Alcohol use: No    Alcohol/week: 0.0 standard drinks  . Drug use: No    Review of Systems Patient denies headaches, rhinorrhea, blurry vision, numbness, shortness of breath, chest pain, edema, cough, abdominal pain, nausea, vomiting, diarrhea, dysuria, fevers, rashes or hallucinations unless otherwise stated above in HPI. ____________________________________________   PHYSICAL EXAM:  VITAL SIGNS: Vitals:   02/11/19 1159 02/11/19 1318  BP: (!) 142/96   Pulse: (!) 121 (!) 101  Resp: 20   Temp: 97.7 F (36.5 C)   SpO2: 100% 100%    Constitutional: Alert and oriented.  Eyes: Conjunctivae are normal.  Head: Atraumatic. Nose: No congestion/rhinnorhea. Mouth/Throat: Mucous membranes are moist.   Neck: No stridor. Painless ROM.  Cardiovascular: Normal  rate, regular rhythm. Grossly normal heart sounds.  Good peripheral circulation. Respiratory: Normal respiratory effort.  No retractions. Lungs CTAB. Gastrointestinal: Soft and nontender. No distention. No abdominal bruits. No CVA tenderness. Genitourinary:  Musculoskeletal: No lower extremity tenderness nor edema.  No joint effusions. Neurologic:  Normal speech and language. No gross focal neurologic deficits are appreciated. No facial droop Skin:   Skin is warm, dry and intact. No rash noted. Psychiatric: Mood and affect are normal. Speech and behavior are normal.  ____________________________________________   LABS (all labs ordered are listed, but only abnormal results are displayed)  Results for orders placed or performed during the hospital encounter of 02/11/19 (from the past 24 hour(s))  CBC with Differential/Platelet     Status: None   Collection Time: 02/11/19 12:34 PM  Result Value Ref Range   WBC 9.4 4.0 - 10.5 K/uL   RBC 4.82 3.87 - 5.11 MIL/uL   Hemoglobin 14.5 12.0 - 15.0 g/dL   HCT 42.7 36.0 - 46.0 %   MCV 88.6 80.0 - 100.0 fL   MCH 30.1 26.0 - 34.0 pg   MCHC 34.0 30.0 - 36.0 g/dL   RDW 14.2 11.5 - 15.5 %   Platelets 239 150 - 400 K/uL   nRBC 0.0 0.0 - 0.2 %   Neutrophils Relative % 81 %   Neutro Abs 7.5 1.7 - 7.7 K/uL   Lymphocytes Relative 12 %   Lymphs Abs 1.2 0.7 - 4.0 K/uL   Monocytes Relative 7 %   Monocytes Absolute 0.6 0.1 - 1.0 K/uL   Eosinophils Relative 0 %   Eosinophils Absolute 0.0 0.0 - 0.5 K/uL   Basophils Relative 0 %   Basophils Absolute 0.0 0.0 - 0.1 K/uL   Immature Granulocytes 0 %   Abs Immature Granulocytes 0.03 0.00 - 0.07 K/uL  Comprehensive metabolic panel     Status: Abnormal   Collection Time: 02/11/19 12:34 PM  Result Value Ref Range   Sodium 137 135 - 145 mmol/L   Potassium 3.9 3.5 - 5.1 mmol/L   Chloride 104 98 - 111 mmol/L   CO2 25 22 - 32 mmol/L   Glucose, Bld 114 (H) 70 - 99 mg/dL   BUN 9 6 - 20 mg/dL   Creatinine, Ser 0.59 0.44 - 1.00 mg/dL   Calcium 9.1 8.9 - 10.3 mg/dL   Total Protein 7.6 6.5 - 8.1 g/dL   Albumin 4.3 3.5 - 5.0 g/dL   AST 17 15 - 41 U/L   ALT 12 0 - 44 U/L   Alkaline Phosphatase 67 38 - 126 U/L   Total Bilirubin 0.9 0.3 - 1.2 mg/dL   GFR calc non Af Amer >60 >60 mL/min   GFR calc Af Amer >60 >60 mL/min   Anion gap 8 5 - 15  Troponin I - ONCE - STAT     Status: None   Collection Time: 02/11/19 12:34 PM  Result Value Ref Range   Troponin I  <0.03 <0.03 ng/mL  TSH     Status: None   Collection Time: 02/11/19 12:34 PM  Result Value Ref Range   TSH 2.679 0.350 - 4.500 uIU/mL  T4, free     Status: None   Collection Time: 02/11/19 12:34 PM  Result Value Ref Range   Free T4 0.98 0.82 - 1.77 ng/dL  Fibrin derivatives D-Dimer ([facility] only)     Status: None   Collection Time: 02/11/19  1:08 PM  Result Value Ref Range   Fibrin derivatives D-dimer (  [facility]) 149.59 0.00 - 499.00 ng/mL (FEU)  Pregnancy, urine POC     Status: None   Collection Time: 02/11/19  1:17 PM  Result Value Ref Range   Preg Test, Ur NEGATIVE NEGATIVE   ____________________________________________  EKG My review and personal interpretation at Time: 12:19   Indication: palpitations  Rate: 95  Rhythm: sinus Axis: normal Other: normal intervals, non specific st abn ____________________________________________  RADIOLOGY  I personally reviewed all radiographic images ordered to evaluate for the above acute complaints and reviewed radiology reports and findings.  These findings were personally discussed with the patient.  Please see medical record for radiology report.  ____________________________________________   PROCEDURES  Procedure(s) performed:  Procedures    Critical Care performed: no ____________________________________________   INITIAL IMPRESSION / ASSESSMENT AND PLAN / ED COURSE  Pertinent labs & imaging results that were available during my care of the patient were reviewed by me and considered in my medical decision making (see chart for details).   DDX: Dehydration, dysrhythmia, ACS, PE, pneumonia, covid 25, hyperthyroidism  Hannah Hudson is a 38 y.o. who presents to the ED with symptoms as described above.  Patient with mild tachycardia and palpitations.  Nontoxic-appearing with no hypoxia.  No fever.  Currently has COVID-19 testing pending.  Blood work will be sent for the but differential will provide IV fluids.  Low risk by [place]  criteria will order d-dimer to further re-stratify.  The patient will be placed on continuous pulse oximetry and telemetry for monitoring.  Laboratory evaluation will be sent to evaluate for the above complaints.     Clinical Course as of Feb 11 1408  Sat Feb 11, 2019  1407 Blood work is reassuring.  Heart rate improved just with fluids.  Patient is complaining of nasal congestion but I do have a high suspicion for allergic rhinitis.  She is already on antibiotics.  No symptoms to suggest covid 19.  This point do believe she stable and appropriate for outpatient follow-up.  Have discussed with the patient and available family all diagnostics and treatments performed thus far and all questions were answered to the best of my ability. The patient demonstrates understanding and agreement with plan.    [PR]    Clinical Course User Index [PR] Merlyn Lot, MD     As part of my medical decision making, I reviewed the following data within the [place] notes reviewed and incorporated, Labs reviewed, notes from prior ED visits and [place] Controlled Substance Database   ____________________________________________   FINAL CLINICAL IMPRESSION(S) / ED DIAGNOSES  Final diagnoses:  Palpitations  Nasal congestion      NEW MEDICATIONS STARTED DURING THIS VISIT:  New Prescriptions   No medications on file     Note:  This document was prepared using Dragon voice recognition software and may include unintentional dictation errors.    Merlyn Lot, MD 02/11/19 1409

## 2019-04-24 ENCOUNTER — Ambulatory Visit: Payer: Managed Care, Other (non HMO) | Admitting: Gastroenterology

## 2019-06-06 ENCOUNTER — Ambulatory Visit (INDEPENDENT_AMBULATORY_CARE_PROVIDER_SITE_OTHER): Payer: Managed Care, Other (non HMO) | Admitting: Obstetrics and Gynecology

## 2019-06-06 ENCOUNTER — Encounter: Payer: Self-pay | Admitting: Obstetrics and Gynecology

## 2019-06-06 ENCOUNTER — Other Ambulatory Visit: Payer: Self-pay

## 2019-06-06 ENCOUNTER — Encounter: Payer: 59 | Admitting: Obstetrics and Gynecology

## 2019-06-06 VITALS — BP 115/74 | HR 50 | Ht 66.0 in | Wt 192.8 lb

## 2019-06-06 DIAGNOSIS — Z01419 Encounter for gynecological examination (general) (routine) without abnormal findings: Secondary | ICD-10-CM

## 2019-06-06 NOTE — Progress Notes (Signed)
HPI:      [name] is a 38 y.o. [phone] who LMP was Patient's last menstrual period was 06/05/2019.  Subjective:   She presents today for her annual examination.  She has no GYN complaints.  She has normal regular cycles.  Her husband has a vasectomy for birth control. She has been taking Spironolactone for dark hair growth and then when she discontinued it she was found to have tachycardia.  She was placed on beta-blockers but she wonders if she cannot go off beta-blockers. She is currently on her menstrual period and states that she has heavy menstrual bleeding and would you declined a pelvic exam today.    Hx: The following portions of the patient's history were reviewed and updated as appropriate:             She  has a past medical history of Elevated glucose tolerance test, Fetal size inconsistent with dates, preeclampsia, prior pregnancy, currently pregnant (2008), Morbid obesity with BMI of 40.0-44.9, adult ([place]), Prior fetal macrosomia, antepartum, PROM (premature rupture of membranes) (03/21/2015), and Urinary tract infection, recurrent. She does not have any pertinent problems on file. She  has a past surgical history that includes Appendectomy (2010); Laparoscopic bilateral salpingectomy (2010); and Cholecystectomy (2007). Her family history includes Bone cancer in her sister; Diabetes in her father and mother; Hypertension in her father and mother; Rheum arthritis in her mother. She  reports that she has never smoked. She has never used smokeless tobacco. She reports that she does not drink alcohol or use drugs. She has a current medication list which includes the following prescription(s): mirtazapine, omeprazole, and propranolol. She has No Known Allergies.       Review of Systems:  Review of Systems  Constitutional: Denied constitutional symptoms, night sweats, recent illness, fatigue, fever, insomnia and weight loss.  Eyes: Denied eye symptoms, eye pain, photophobia,  vision change and visual disturbance.  Ears/Nose/Throat/Neck: Denied ear, nose, throat or neck symptoms, hearing loss, nasal discharge, sinus congestion and sore throat.  Cardiovascular: Denied cardiovascular symptoms, arrhythmia, chest pain/pressure, edema, exercise intolerance, orthopnea and palpitations.  Respiratory: Denied pulmonary symptoms, asthma, pleuritic pain, productive sputum, cough, dyspnea and wheezing.  Gastrointestinal: Denied, gastro-esophageal reflux, melena, nausea and vomiting.  Genitourinary: Denied genitourinary symptoms including symptomatic vaginal discharge, pelvic relaxation issues, and urinary complaints.  Musculoskeletal: Denied musculoskeletal symptoms, stiffness, swelling, muscle weakness and myalgia.  Dermatologic: Denied dermatology symptoms, rash and scar.  Neurologic: Denied neurology symptoms, dizziness, headache, neck pain and syncope.  Psychiatric: Denied psychiatric symptoms, anxiety and depression.  Endocrine: Denied endocrine symptoms including hot flashes and night sweats.   Meds:   Current Outpatient Medications on File Prior to Visit  Medication Sig Dispense Refill  . mirtazapine (REMERON) 7.5 MG tablet     . omeprazole (PRILOSEC OTC) 20 MG tablet Take by mouth.    . propranolol (INDERAL) 20 MG tablet Take by mouth.     No current facility-administered medications on file prior to visit.     Objective:     Vitals:   06/06/19 1001  BP: 115/74  Pulse: (!) 50              Physical examination General NAD, Conversant  HEENT Atraumatic; Op clear with mmm.  Normo-cephalic. Pupils reactive. Anicteric sclerae  Thyroid/Neck Smooth without nodularity or enlargement. Normal ROM.  Neck Supple.  Skin No rashes, lesions or ulceration. Normal palpated skin turgor. No nodularity.  Breasts: No masses or discharge.  Symmetric.  No  axillary adenopathy.  Lungs: Clear to auscultation.No rales or wheezes. Normal Respiratory effort, no retractions.  Heart:  NSR.  No murmurs or rubs appreciated. No periferal edema  Abdomen: Soft.  Non-tender.  No masses.  No HSM. No hernia  Extremities: Moves all appropriately.  Normal ROM for age. No lymphadenopathy.  Neuro: Oriented to PPT.  Normal mood. Normal affect.     Pelvic:   Vulva: Normal appearance.  No lesions.  Vagina: No lesions or abnormalities noted.  Support: Normal pelvic support.  Urethra No masses tenderness or scarring.  Meatus Normal size without lesions or prolapse.  Cervix: Normal appearance.  No lesions.  Anus: Normal exam.  No lesions.  Perineum: Normal exam.  No lesions.        Bimanual  deferred     Assessment:    G4P3010 Patient Active Problem List   Diagnosis Date Noted  . 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 Hyperlipidemia, mild 11/18/2017  . Sore throat, chronic 11/17/2016  . Adult BMI 31.0-31.9 kg/sq m 04/19/2015     1. Well woman exam with routine gynecological exam     Normal exam   Plan:            1.  Basic Screening Recommendations The basic screening recommendations for asymptomatic women were discussed with the patient during her visit.  The age-appropriate recommendations were discussed with her and the rational for the tests reviewed.  When I am informed by the patient that another primary care physician has previously obtained the age-appropriate tests and they are up-to-date, only outstanding tests are ordered and referrals given as necessary.  Abnormal results of tests will be discussed with her when all of her results are completed. Baseline mammogram ordered -labs done through PCP  Orders No orders of the defined types were placed in this encounter.   No orders of the defined types were placed in this encounter.       F/U  Return in about 1 year (around 06/05/2020) for Annual Physical.  Finis Bud, M.D. 06/06/2019 10:39 AM

## 2019-06-06 NOTE — Progress Notes (Signed)
Patient comes in today for her yearly physical. She is not due for anything. No concerns today. She is on her cycle today that is pretty heavy.

## 2019-11-16 DIAGNOSIS — Z86018 Personal history of other benign neoplasm: Secondary | ICD-10-CM

## 2019-11-16 HISTORY — DX: Personal history of other benign neoplasm: Z86.018

## 2019-11-22 ENCOUNTER — Other Ambulatory Visit
Admission: RE | Admit: 2019-11-22 | Discharge: 2019-11-22 | Disposition: A | Discharge status: Home / Self Care | Payer: Managed Care, Other (non HMO) | Source: Ambulatory Visit | Attending: Physician Assistant | Admitting: Physician Assistant

## 2019-11-22 DIAGNOSIS — R0602 Shortness of breath: Secondary | ICD-10-CM | POA: Diagnosis present

## 2019-11-22 DIAGNOSIS — R0789 Other chest pain: Secondary | ICD-10-CM | POA: Diagnosis present

## 2019-11-22 LAB — FIBRIN DERIVATIVES D-DIMER (ARMC ONLY): Fibrin derivatives D-dimer (ARMC): 191.83 ng/mL (FEU) (ref 0.00–499.00)

## 2020-01-26 ENCOUNTER — Other Ambulatory Visit: Payer: Self-pay

## 2020-01-26 ENCOUNTER — Encounter: Payer: Self-pay | Admitting: Obstetrics and Gynecology

## 2020-01-26 ENCOUNTER — Ambulatory Visit: Payer: Managed Care, Other (non HMO) | Admitting: Obstetrics and Gynecology

## 2020-01-26 VITALS — BP 122/84 | HR 73 | Ht 66.0 in | Wt 233.6 lb

## 2020-01-26 DIAGNOSIS — Z30011 Encounter for initial prescription of contraceptive pills: Secondary | ICD-10-CM | POA: Diagnosis not present

## 2020-01-26 DIAGNOSIS — N943 Premenstrual tension syndrome: Secondary | ICD-10-CM

## 2020-01-26 MED ORDER — ETONOGESTREL-ETHINYL ESTRADIOL 0.12-0.015 MG/24HR VA RING: VAGINAL_RING | VAGINAL | 1 refills | Status: DC

## 2020-01-26 NOTE — Progress Notes (Signed)
HPI:      Hannah Hudson is a 39 y.o. [phone] who LMP was Patient's last menstrual period was 01/22/2020.  Subjective:   She presents today to discuss symptoms that she is having that seem to be menstrual cycle related.  She has facial pressure and mood changes and "feeling in a fog" that seem to be somewhat centered around menstrual cycles.  Patient has normal monthly menses. Patient has undergone significant work-up with internist and ENT and has received allergy shots without success. Patient's husband has a vasectomy for birth control. She wonders if cycle control/stabilization of hormones could decrease her symptoms.     Hx: The following portions of the patient's history were reviewed and updated as appropriate:             She  has a past medical history of Elevated glucose tolerance test, Fetal size inconsistent with dates, preeclampsia, prior pregnancy, currently pregnant (2008), Morbid obesity with BMI of 40.0-44.9, adult ([place]), Prior fetal macrosomia, antepartum, PROM (premature rupture of membranes) (03/21/2015), and Urinary tract infection, recurrent. She does not have any pertinent problems on file. She  has a past surgical history that includes Appendectomy (2010); Laparoscopic bilateral salpingectomy (2010); and Cholecystectomy (2007). Her family history includes Bone cancer in her sister; Diabetes in her father and mother; Hypertension in her father and mother; Rheum arthritis in her mother. She  reports that she has never smoked. She has never used smokeless tobacco. She reports that she does not drink alcohol or use drugs. She has a current medication list which includes the following prescription(s): mirtazapine, omeprazole, propranolol, and etonogestrel-ethinyl estradiol. She has No Known Allergies.       Review of Systems:  Review of Systems  Constitutional: Denied constitutional symptoms, night sweats, recent illness, fatigue, fever, insomnia and weight loss.   Eyes: Denied eye symptoms, eye pain, photophobia, vision change and visual disturbance.  Ears/Nose/Throat/Neck: Denied ear, nose, throat or neck symptoms, hearing loss, nasal discharge, sinus congestion and sore throat.  Cardiovascular: Denied cardiovascular symptoms, arrhythmia, chest pain/pressure, edema, exercise intolerance, orthopnea and palpitations.  Respiratory: Denied pulmonary symptoms, asthma, pleuritic pain, productive sputum, cough, dyspnea and wheezing.  Gastrointestinal: Denied, gastro-esophageal reflux, melena, nausea and vomiting.  Genitourinary: Denied genitourinary symptoms including symptomatic vaginal discharge, pelvic relaxation issues, and urinary complaints.  Musculoskeletal: Denied musculoskeletal symptoms, stiffness, swelling, muscle weakness and myalgia.  Dermatologic: Denied dermatology symptoms, rash and scar.  Neurologic: Denied neurology symptoms, dizziness, headache, neck pain and syncope.  Psychiatric: Denied psychiatric symptoms, anxiety and depression.  Endocrine: See HPI for additional information.   Meds:   Current Outpatient Medications on File Prior to Visit  Medication Sig Dispense Refill  . mirtazapine (REMERON) 7.5 MG tablet     . omeprazole (PRILOSEC OTC) 20 MG tablet Take by mouth.    . propranolol (INDERAL) 20 MG tablet Take by mouth.     No current facility-administered medications on file prior to visit.    Objective:     Vitals:   01/26/20 1045  BP: 122/84  Pulse: 73                Assessment:    G4P3010 Patient Active Problem List   Diagnosis Date Noted  . 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 Hyperlipidemia, mild 11/18/2017  . Sore throat, chronic 11/17/2016  . Adult BMI 31.0-31.9 kg/sq m 04/19/2015     1. Initiation of OCP (BCP)   2. PMS (premenstrual syndrome)     Because symptoms seem cycle  related-possibly a form of PMS.   Plan:            1.  Discussed multiple cycle control methods and treatments for PMS  including OCPs, NuvaRing, IUD, and use of Zoloft.  Patient has chosen NuvaRing.  She has used NuvaRing successfully before for birth control.  Orders No orders of the defined types were placed in this encounter.    Meds ordered this encounter  Medications  . etonogestrel-ethinyl estradiol (NUVARING) 0.12-0.015 MG/24HR vaginal ring    Sig: Insert vaginally and leave in place for 30 days - remove and replace as directed    Dispense:  3 each    Refill:  1      F/U  Return for Annual Physical, As scheduled. I spent 25 minutes involved in the care of this patient preparing to see the patient by obtaining and reviewing her medical history (including labs, imaging tests and prior procedures), documenting clinical information in the electronic health record (EHR), counseling and coordinating care plans, writing and sending prescriptions, ordering tests or procedures and directly communicating with the patient by discussing pertinent items from her history and physical exam as well as detailing my assessment and plan as noted above so that she has an informed understanding.  All of her questions were answered.  Finis Bud, M.D. 01/26/2020 11:17 AM

## 2020-02-02 ENCOUNTER — Other Ambulatory Visit: Payer: Self-pay

## 2020-02-02 ENCOUNTER — Emergency Department
Admission: EM | Admit: 2020-02-02 | Discharge: 2020-02-02 | Disposition: A | Discharge status: Home / Self Care | Payer: Managed Care, Other (non HMO) | Attending: Emergency Medicine | Admitting: Emergency Medicine

## 2020-02-02 DIAGNOSIS — Z79899 Other long term (current) drug therapy: Secondary | ICD-10-CM | POA: Insufficient documentation

## 2020-02-02 DIAGNOSIS — R0981 Nasal congestion: Secondary | ICD-10-CM | POA: Insufficient documentation

## 2020-02-02 DIAGNOSIS — R519 Headache, unspecified: Secondary | ICD-10-CM

## 2020-02-02 LAB — CBC WITH DIFFERENTIAL/PLATELET
Abs Immature Granulocytes: 0.02 10*3/uL (ref 0.00–0.07)
Basophils Absolute: 0 10*3/uL (ref 0.0–0.1)
Basophils Relative: 0 %
Eosinophils Absolute: 0 10*3/uL (ref 0.0–0.5)
Eosinophils Relative: 0 %
HCT: 37.1 % (ref 36.0–46.0)
Hemoglobin: 12.2 g/dL (ref 12.0–15.0)
Immature Granulocytes: 0 %
Lymphocytes Relative: 20 %
Lymphs Abs: 1.9 10*3/uL (ref 0.7–4.0)
MCH: 27.1 pg (ref 26.0–34.0)
MCHC: 32.9 g/dL (ref 30.0–36.0)
MCV: 82.4 fL (ref 80.0–100.0)
Monocytes Absolute: 0.7 10*3/uL (ref 0.1–1.0)
Monocytes Relative: 8 %
Neutro Abs: 7 10*3/uL (ref 1.7–7.7)
Neutrophils Relative %: 72 %
Platelets: 307 10*3/uL (ref 150–400)
RBC: 4.5 MIL/uL (ref 3.87–5.11)
RDW: 15.2 % (ref 11.5–15.5)
WBC: 9.7 10*3/uL (ref 4.0–10.5)
nRBC: 0 % (ref 0.0–0.2)

## 2020-02-02 LAB — BASIC METABOLIC PANEL
Anion gap: 8 (ref 5–15)
BUN: 13 mg/dL (ref 6–20)
CO2: 25 mmol/L (ref 22–32)
Calcium: 8.8 mg/dL — ABNORMAL LOW (ref 8.9–10.3)
Chloride: 107 mmol/L (ref 98–111)
Creatinine, Ser: 0.78 mg/dL (ref 0.44–1.00)
GFR calc Af Amer: >60 mL/min (ref >60)
GFR calc non Af Amer: >60 mL/min (ref >60)
Glucose, Bld: 124 mg/dL — ABNORMAL HIGH (ref 70–99)
Potassium: 3.8 mmol/L (ref 3.5–5.1)
Sodium: 140 mmol/L (ref 135–145)

## 2020-02-02 MED ORDER — CETIRIZINE HCL 10 MG PO TABS: 10.0000 mg | ORAL_TABLET | Freq: Every day | ORAL | 0 refills | Status: DC

## 2020-02-02 MED ORDER — MUCINEX 600 MG PO TB12: 600.0000 mg | ORAL_TABLET | Freq: Two times a day (BID) | ORAL | 0 refills | Status: AC

## 2020-02-02 NOTE — ED Triage Notes (Addendum)
Pt c/o dry mucous membranes with HA for the past 2 weeks, states she had a COvid + at the [facility] self swab but then had 2 negative PCR at [facility] since. States she is just concerned about the continued sx. Pt states she was dx with shingles recently with a rash radiating from under the right breast around to the back

## 2020-02-02 NOTE — ED Provider Notes (Signed)
[facility] Emergency Department Provider Note   ____________________________________________   First MD Initiated Contact with Patient 02/02/20 1125     (approximate)  I have reviewed the triage vital signs and the nursing notes.   HISTORY  Chief Complaint Congestion, headache   HPI Hannah Hudson is a 39 y.o. female with no significant past medical history who presents to the ED complaining of congestion and headache.  Patient reports that she has been feeling very dry around her nose and mouth for the past 2 weeks with intermittent headaches as well as neck pain.  She has regularly been checking her temperature and denies any fevers, but states she has been feeling congested.  She had a point-of-care Covid test that was positive recently, but since had to follow-up PCR's that were both negative.  She denies any chest pain, cough, or shortness of breath.  She was seen by her PCP for this problem as well as a rash to her abdomen, diagnosed with shingles and started on prednisone.  She also was seen at an urgent care, where there was concern for bacterial sinusitis and she completed a course of Augmentin with no improvement.  She does report a history of allergies, was previously receiving allergy injections but states she has not been getting these since her positive Covid test.        Past Medical History:  Diagnosis Date  . Elevated glucose tolerance test    3hr wnl  . Fetal size inconsistent with dates    03/05/2015- growth scan today 86th percentile growth  . Hx of preeclampsia, prior pregnancy, currently pregnant 2008   11/14/2014- plan monitory bp, serial u/s,third trimester antepartum testing, baseline serum creatinite  . Morbid obesity with BMI of 40.0-44.9, adult ([place])   . Prior fetal macrosomia, antepartum    limit weight gain 10-15pounds, early glucola  . PROM (premature rupture of membranes) 03/21/2015   returning neg; nitrazine ?, betamehasone  12.5mg  IM q 24h x2, afi tomorrow and weekly afi 03/22/2015-13.5cm  . Urinary tract infection, recurrent    11/14/2014- monthly urine cultures    Patient Active Problem List   Diagnosis Date Noted  . 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 Hyperlipidemia, mild 11/18/2017  . Sore throat, chronic 11/17/2016  . Adult BMI 31.0-31.9 kg/sq m 04/19/2015    Past Surgical History:  Procedure Laterality Date  . APPENDECTOMY  2010   laparoscopic  . CHOLECYSTECTOMY  2007  . LAPAROSCOPIC BILATERAL SALPINGECTOMY  2010   bilateral paratubal cyst excision    Prior to Admission medications   Medication Sig Start Date End Date Taking? Authorizing Provider  cetirizine (ZYRTEC ALLERGY) 10 MG tablet Take 1 tablet (10 mg total) by mouth daily. 02/02/20 03/03/20  Blake Divine, MD  etonogestrel-ethinyl estradiol Ardyth Harps) 0.12-0.015 MG/24HR vaginal ring Insert vaginally and leave in place for 30 days - remove and replace as directed 01/26/20   Harlin Heys, MD  guaiFENesin (MUCINEX) 600 MG 12 hr tablet Take 1 tablet (600 mg total) by mouth 2 (two) times daily. 02/02/20 03/03/20  Blake Divine, MD  mirtazapine (REMERON) 7.5 MG tablet  05/31/19   [provider]  omeprazole (PRILOSEC OTC) 20 MG tablet Take by mouth. 03/08/19 03/07/20  [provider]  propranolol (INDERAL) 20 MG tablet Take by mouth. 03/08/19 03/07/20  [provider]    Allergies Patient has no known allergies.  Family History  Problem Relation Age of Onset  . Bone cancer Sister   . Hypertension  Mother   . Diabetes Mother   . Rheum arthritis Mother   . Hypertension Father   . Diabetes Father     Social History Social History   Tobacco Use  . Smoking status: Never Smoker  . Smokeless tobacco: Never Used  Substance Use Topics  . Alcohol use: No    Alcohol/week: 0.0 standard drinks  . Drug use: No    Review of Systems  Constitutional: No fever/chills Eyes: No visual changes. ENT: No sore throat.   Positive for congestion. Cardiovascular: Denies chest pain. Respiratory: Denies shortness of breath. Gastrointestinal: No abdominal pain.  No nausea, no vomiting.  No diarrhea.  No constipation. Genitourinary: Negative for dysuria. Musculoskeletal: Negative for back pain. Skin: Negative for rash. Neurological: Positive for headaches, negative for focal weakness or numbness.  ____________________________________________   PHYSICAL EXAM:  VITAL SIGNS: ED Triage Vitals  Enc Vitals Group     BP 02/02/20 0956 (!) 144/79     Pulse Rate 02/02/20 0956 (!) 101     Resp 02/02/20 0956 18     Temp 02/02/20 0956 98.6 F (37 C)     Temp Source 02/02/20 0956 Oral     SpO2 02/02/20 0956 100 %     Weight 02/02/20 0957 233 lb 9.6 oz (106 kg)     Height 02/02/20 0957 5\' 6"  (1.676 m)     Head Circumference --      Peak Flow --      Pain Score 02/02/20 0957 8     Pain Loc --      Pain Edu? --      Excl. in [place]? --     Constitutional: Alert and oriented. Eyes: Conjunctivae are normal. Head: Atraumatic. Nose: No congestion/rhinnorhea. Mouth/Throat: Mucous membranes are moist. Neck: Normal ROM Cardiovascular: Normal rate, regular rhythm. Grossly normal heart sounds. Respiratory: Normal respiratory effort.  No retractions. Lungs CTAB. Gastrointestinal: Soft and nontender. No distention. Genitourinary: deferred Musculoskeletal: No lower extremity tenderness nor edema. Neurologic:  Normal speech and language. No gross focal neurologic deficits are appreciated. Skin:  Skin is warm, dry and intact. No rash noted. Psychiatric: Mood and affect are normal. Speech and behavior are normal.  ____________________________________________   LABS (all labs ordered are listed, but only abnormal results are displayed)  Labs Reviewed  BASIC METABOLIC PANEL - Abnormal; Notable for the following components:      Result Value   Glucose, Bld 124 (*)    Calcium 8.8 (*)    All other components within  normal limits  CBC WITH DIFFERENTIAL/PLATELET    PROCEDURES  Procedure(s) performed (including Critical Care):  Procedures   ____________________________________________   INITIAL IMPRESSION / ASSESSMENT AND PLAN / ED COURSE       39 year old female presents to the ED with congestion, headaches, and a dry sensation around her nose and mouth over the past 2 weeks.  She has no purulent drainage and oropharynx is clear, doubt infectious process.  She reports concerns for meningitis, but she has had no fevers or neck stiffness, has full range of motion in her neck without discomfort on exam.  She also has a benign and nonfocal neurologic exam.  Symptoms sound most consistent with sinus congestion and sinus headache.  We will treat with Mucinex and antihistamine, patient counseled to follow-up with her PCP and otherwise return to the ED for new or worsening symptoms.  Patient agrees with plan.      ____________________________________________   FINAL CLINICAL IMPRESSION(S) / ED DIAGNOSES  Final diagnoses:  Sinus congestion  Acute nonintractable headache, unspecified headache type     ED Discharge Orders         Ordered    guaiFENesin (MUCINEX) 600 MG 12 hr tablet  2 times daily     02/02/20 1237    cetirizine (ZYRTEC ALLERGY) 10 MG tablet  Daily     02/02/20 1237           Note:  This document was prepared using Dragon voice recognition software and may include unintentional dictation errors.   Blake Divine, MD 02/02/20 1242

## 2020-02-22 DIAGNOSIS — J301 Allergic rhinitis due to pollen: Secondary | ICD-10-CM | POA: Insufficient documentation

## 2020-03-26 ENCOUNTER — Other Ambulatory Visit: Payer: Self-pay | Admitting: Neurology

## 2020-03-26 DIAGNOSIS — G43839 Menstrual migraine, intractable, without status migrainosus: Secondary | ICD-10-CM

## 2020-03-26 DIAGNOSIS — R2990 Unspecified symptoms and signs involving the nervous system: Secondary | ICD-10-CM

## 2020-04-09 ENCOUNTER — Other Ambulatory Visit: Payer: Self-pay

## 2020-04-09 ENCOUNTER — Ambulatory Visit
Admission: RE | Admit: 2020-04-09 | Discharge: 2020-04-09 | Disposition: A | Discharge status: Home / Self Care | Payer: Managed Care, Other (non HMO) | Source: Ambulatory Visit | Attending: Neurology | Admitting: Neurology

## 2020-04-09 DIAGNOSIS — G43839 Menstrual migraine, intractable, without status migrainosus: Secondary | ICD-10-CM

## 2020-04-09 DIAGNOSIS — R2990 Unspecified symptoms and signs involving the nervous system: Secondary | ICD-10-CM

## 2020-04-09 MED ORDER — GADOBUTROL 1 MMOL/ML IV SOLN
10.0000 mL | Freq: Once | INTRAVENOUS | Status: AC | PRN
  Administered 2020-04-09: 10 mL via INTRAVENOUS

## 2020-04-21 ENCOUNTER — Other Ambulatory Visit: Payer: Self-pay

## 2020-04-21 ENCOUNTER — Encounter: Payer: Self-pay | Admitting: Emergency Medicine

## 2020-04-21 ENCOUNTER — Emergency Department
Admission: EM | Admit: 2020-04-21 | Discharge: 2020-04-21 | Disposition: A | Discharge status: Home / Self Care | Payer: Managed Care, Other (non HMO) | Attending: Emergency Medicine | Admitting: Emergency Medicine

## 2020-04-21 DIAGNOSIS — Z79899 Other long term (current) drug therapy: Secondary | ICD-10-CM | POA: Insufficient documentation

## 2020-04-21 DIAGNOSIS — T50905A Adverse effect of unspecified drugs, medicaments and biological substances, initial encounter: Secondary | ICD-10-CM | POA: Insufficient documentation

## 2020-04-21 DIAGNOSIS — R42 Dizziness and giddiness: Secondary | ICD-10-CM | POA: Diagnosis present

## 2020-04-21 DIAGNOSIS — R519 Headache, unspecified: Secondary | ICD-10-CM | POA: Insufficient documentation

## 2020-04-21 DIAGNOSIS — R531 Weakness: Secondary | ICD-10-CM | POA: Diagnosis not present

## 2020-04-21 DIAGNOSIS — F329 Major depressive disorder, single episode, unspecified: Secondary | ICD-10-CM | POA: Insufficient documentation

## 2020-04-21 DIAGNOSIS — R200 Anesthesia of skin: Secondary | ICD-10-CM | POA: Diagnosis not present

## 2020-04-21 DIAGNOSIS — T887XXA Unspecified adverse effect of drug or medicament, initial encounter: Secondary | ICD-10-CM

## 2020-04-21 DIAGNOSIS — E236 Other disorders of pituitary gland: Secondary | ICD-10-CM | POA: Diagnosis not present

## 2020-04-21 LAB — URINALYSIS, COMPLETE (UACMP) WITH MICROSCOPIC
Bacteria, UA: NONE SEEN
Bilirubin Urine: NEGATIVE
Glucose, UA: NEGATIVE mg/dL
Hgb urine dipstick: NEGATIVE
Ketones, ur: NEGATIVE mg/dL
Leukocytes,Ua: NEGATIVE
Nitrite: NEGATIVE
Protein, ur: NEGATIVE mg/dL
Specific Gravity, Urine: 1.003 — ABNORMAL LOW (ref 1.005–1.030)
WBC, UA: NONE SEEN WBC/hpf (ref 0–5)
pH: 7 (ref 5.0–8.0)

## 2020-04-21 LAB — CBC
HCT: 37.1 % (ref 36.0–46.0)
Hemoglobin: 12 g/dL (ref 12.0–15.0)
MCH: 26.9 pg (ref 26.0–34.0)
MCHC: 32.3 g/dL (ref 30.0–36.0)
MCV: 83.2 fL (ref 80.0–100.0)
Platelets: 290 10*3/uL (ref 150–400)
RBC: 4.46 MIL/uL (ref 3.87–5.11)
RDW: 17.3 % — ABNORMAL HIGH (ref 11.5–15.5)
WBC: 7.9 10*3/uL (ref 4.0–10.5)
nRBC: 0 % (ref 0.0–0.2)

## 2020-04-21 LAB — BASIC METABOLIC PANEL
Anion gap: 5 (ref 5–15)
BUN: 15 mg/dL (ref 6–20)
CO2: 25 mmol/L (ref 22–32)
Calcium: 8.9 mg/dL (ref 8.9–10.3)
Chloride: 108 mmol/L (ref 98–111)
Creatinine, Ser: 0.93 mg/dL (ref 0.44–1.00)
GFR calc Af Amer: >60 mL/min (ref >60)
GFR calc non Af Amer: >60 mL/min (ref >60)
Glucose, Bld: 108 mg/dL — ABNORMAL HIGH (ref 70–99)
Potassium: 3.9 mmol/L (ref 3.5–5.1)
Sodium: 138 mmol/L (ref 135–145)

## 2020-04-21 LAB — POCT PREGNANCY, URINE: Preg Test, Ur: NEGATIVE

## 2020-04-21 MED ORDER — SODIUM CHLORIDE 0.9% FLUSH: 3.0000 mL | Freq: Once | INTRAVENOUS | Status: DC

## 2020-04-21 NOTE — ED Notes (Signed)
Pt states that she started topiramax recently for headaches and after she doubled her dose per RX instructions, she started feeling dizzy, more depressed, weak, and "off". Pt denies any SI, just endorses wanting to lay around all day and do nothing. Pt neurologically intact otherwise. Dizziness also made worse when getting in hot shower per pt.

## 2020-04-21 NOTE — ED Provider Notes (Signed)
[facility] Emergency Department Provider Note  ____________________________________________  Time seen: Approximately 12:42 PM  I have reviewed the triage vital signs and the nursing notes.   HISTORY  Chief Complaint Dizziness, Weakness, and Fall    HPI Hannah Hudson is a 39 y.o. female who presents the emergency department complaining of dizziness, fatigue, malaise, headaches.  Patient states that she has been seen by neurology for ongoing headaches.  Patient states that she had been having frontal headaches for over a year.  She had seen ENT originally but multiple work-ups and multiple medication trials were not effective.  Patient been referred to neurology.  Patient was started on Topamax for headache prevention and sent for an MRI.  Patient states that she has not discussed the results but does have findings on the MRI  consistent with partially empty sella.  Patient is unsure what this means and would like to discuss results.  Patient has been getting over an upper respiratory infection as well.  This is improving and she has no complaints at this time.  Patient denies any vision changes, direct head trauma, neck pain, chest pain, shortness of breath, domino pain, nausea or vomiting.  Patient does have numbness of the left foot which is currently being evaluated by neurology as well and is scheduled for nerve conduction studies.  No change from baseline.  No new numbness or tingling.        Past Medical History:  Diagnosis Date  . Elevated glucose tolerance test    3hr wnl  . Fetal size inconsistent with dates    03/05/2015- growth scan today 86th percentile growth  . Hx of preeclampsia, prior pregnancy, currently pregnant 2008   11/14/2014- plan monitory bp, serial u/s,third trimester antepartum testing, baseline serum creatinite  . Morbid obesity with BMI of 40.0-44.9, adult ([place])   . Prior fetal macrosomia, antepartum    limit weight gain 10-15pounds,  early glucola  . PROM (premature rupture of membranes) 03/21/2015   returning neg; nitrazine ?, betamehasone 12.5mg  IM q 24h x2, afi tomorrow and weekly afi 03/22/2015-13.5cm  . Urinary tract infection, recurrent    11/14/2014- monthly urine cultures    Patient Active Problem List   Diagnosis Date Noted  . 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 Hyperlipidemia, mild 11/18/2017  . Sore throat, chronic 11/17/2016  . Adult BMI 31.0-31.9 kg/sq m 04/19/2015    Past Surgical History:  Procedure Laterality Date  . APPENDECTOMY  2010   laparoscopic  . CHOLECYSTECTOMY  2007  . LAPAROSCOPIC BILATERAL SALPINGECTOMY  2010   bilateral paratubal cyst excision    Prior to Admission medications   Medication Sig Start Date End Date Taking? Authorizing Provider  cetirizine (ZYRTEC ALLERGY) 10 MG tablet Take 1 tablet (10 mg total) by mouth daily. 02/02/20 03/03/20  Blake Divine, MD  etonogestrel-ethinyl estradiol Ardyth Harps) 0.12-0.015 MG/24HR vaginal ring Insert vaginally and leave in place for 30 days - remove and replace as directed 01/26/20   Harlin Heys, MD  mirtazapine (REMERON) 7.5 MG tablet  05/31/19   [provider]  omeprazole (PRILOSEC OTC) 20 MG tablet Take by mouth. 03/08/19 03/07/20  [provider]  propranolol (INDERAL) 20 MG tablet Take by mouth. 03/08/19 03/07/20  [provider]    Allergies Patient has no known allergies.  Family History  Problem Relation Age of Onset  . Bone cancer Sister   . Hypertension Mother   . Diabetes Mother   . Rheum arthritis Mother   .  Hypertension Father   . Diabetes Father     Social History Social History   Tobacco Use  . Smoking status: Never Smoker  . Smokeless tobacco: Never Used  Substance Use Topics  . Alcohol use: No    Alcohol/week: 0.0 standard drinks  . Drug use: No     Review of Systems  Constitutional: No fever/chills Eyes: No visual changes. No discharge ENT: No upper respiratory  complaints. Cardiovascular: no chest pain. Respiratory: no cough. No SOB. Gastrointestinal: No abdominal pain.  No nausea, no vomiting.  No diarrhea.  No constipation. Musculoskeletal: Negative for musculoskeletal pain. Skin: Negative for rash, abrasions, lacerations, ecchymosis. Neurological: Chronic headaches.  No change in pattern.  Focal weakness or numbness. 10-point ROS otherwise negative.  ____________________________________________   PHYSICAL EXAM:  VITAL SIGNS: ED Triage Vitals  Enc Vitals Group     BP 04/21/20 1123 (!) 155/69     Pulse Rate 04/21/20 1123 92     Resp 04/21/20 1123 18     Temp 04/21/20 1123 98.1 F (36.7 C)     Temp Source 04/21/20 1123 Oral     SpO2 04/21/20 1123 97 %     Weight 04/21/20 1125 225 lb (102.1 kg)     Height 04/21/20 1125 5\' 6"  (1.676 m)     Head Circumference --      Peak Flow --      Pain Score 04/21/20 1125 4     Pain Loc --      Pain Edu? --      Excl. in [place]? --      Constitutional: Alert and oriented. Well appearing and in no acute distress. Eyes: Conjunctivae are normal. PERRL. EOMI. Head: Atraumatic. ENT:      Ears: EACs unremarkable bilaterally.  TM on right has tube visualized.  EAC on left has multiple areas of scarring consistent with previous tube placements.  No gross injection or erythema to either TM.      Nose: Mild congestion/rhinnorhea.      Mouth/Throat: Mucous membranes are moist.  Neck: No stridor.  No cervical spine tenderness to palpation.  Cardiovascular: Normal rate, regular rhythm. Normal S1 and S2.  Good peripheral circulation. Respiratory: Normal respiratory effort without tachypnea or retractions. Lungs CTAB. Good air entry to the bases with no decreased or absent breath sounds. Musculoskeletal: Full range of motion to all extremities. No gross deformities appreciated. Neurologic:  Normal speech and language. No gross focal neurologic deficits are appreciated.  Cranial nerves II through XII grossly  intact.  Negative Romberg's and pronator drift. Skin:  Skin is warm, dry and intact. No rash noted. Psychiatric: Mood and affect are normal. Speech and behavior are normal. Patient exhibits appropriate insight and judgement.   ____________________________________________   LABS (all labs ordered are listed, but only abnormal results are displayed)  Labs Reviewed  BASIC METABOLIC PANEL - Abnormal; Notable for the following components:      Result Value   Glucose, Bld 108 (*)    All other components within normal limits  CBC - Abnormal; Notable for the following components:   RDW 17.3 (*)    All other components within normal limits  URINALYSIS, COMPLETE (UACMP) WITH MICROSCOPIC - Abnormal; Notable for the following components:   Color, Urine STRAW (*)    APPearance CLEAR (*)    Specific Gravity, Urine 1.003 (*)    All other components within normal limits  POC URINE PREG, ED  POCT PREGNANCY, URINE  CBG MONITORING, ED  ____________________________________________  EKG   ____________________________________________  RADIOLOGY   MRI from 04/09/2020 reviewed  Study Result  Narrative & Impression  CLINICAL DATA:  39 year old female with recurrent headaches and intermittent numbness in the left foot for several months.  EXAM: MRI HEAD WITHOUT AND WITH CONTRAST  TECHNIQUE: Multiplanar, multiecho pulse sequences of the brain and surrounding structures were obtained without and with intravenous contrast.  CONTRAST:  42mL GADAVIST GADOBUTROL 1 MMOL/ML IV SOLN  COMPARISON:  None.  FINDINGS: Brain: Partially empty sella.  Cerebral volume is within normal limits. No restricted diffusion to suggest acute infarction. No midline shift, mass effect, evidence of mass lesion, ventriculomegaly, extra-axial collection or acute intracranial hemorrhage. Cervicomedullary junction within normal limits.  Pearline Cables and white matter signal is within normal limits throughout  the brain. No encephalomalacia or chronic cerebral blood products.  No abnormal enhancement identified.  No dural thickening.  Vascular: Major intracranial vascular flow voids are preserved. The right vertebral artery appears dominant. The major dural venous sinuses are enhancing and appear to be patent.  Skull and upper cervical spine: Normal visible cervical spine. Hyperostosis of the calvarium, normal variant. Visualized bone marrow signal is within normal limits.  Sinuses/Orbits: Negative orbits.  Paranasal sinuses are clear.  Other: Mastoids are well pneumatized. Visible internal auditory structures appear normal. Scalp and face soft tissues appear negative.  IMPRESSION: 1. Partially empty sella, often a normal anatomic variant but can be associated with idiopathic intracranial hypertension (pseudotumor cerebri).  2. Otherwise normal MRI appearance of the brain.   Electronically Signed   By: Genevie Ann M.D.   On: 04/09/2020 08:41     External Result Report     No results found.  ____________________________________________    PROCEDURES  Procedure(s) performed:    Procedures    Medications  sodium chloride flush (NS) 0.9 % injection 3 mL (3 mLs Intravenous Not Given 04/21/20 1229)     ____________________________________________   INITIAL IMPRESSION / ASSESSMENT AND PLAN / ED COURSE  Pertinent labs & imaging results that were available during my care of the patient were reviewed by me and considered in my medical decision making (see chart for details).  Review of the [place] CSRS was performed in accordance of the [place] prior to dispensing any controlled drugs.           Patient's diagnosis is consistent with medication side effect.  Patient presented to the emergency department complaining of weakness, drowsiness, mood changes after starting Topamax for her headaches.  Patient recently increased her dose and began to experience symptoms.   Today patient felt exceptionally weak, was coming down the stairs and had to abruptly sit down at the bottom of the stairs.  Patient states that she felt "almost like I was in a tunnel" for a brief period of time and then returned to her normal self.  Patient states that she is just felt exceptionally tired, weak, dizzy, having mood changes after starting this medication.  Patient is currently being followed by neurology for these ongoing headaches.  Recent MRI showed partially empty sella which could be anatomical variant versus idiopathic intracranial hypertension.  At this time, I discussed what these results meant but advised the patient that she needed to further discuss the results, treatment plan with her neurologist.  At this time I advised the patient to return to the starting dose of her Topamax as it appears that the increased dose is causing her multiple symptoms.  Patient verbalizes understanding of same.  At this  time I would not repeat any imaging as patient had an MRI at the start of this month.  As patient has had no direct head trauma I do not feel that CT scan would offer any further information.  Patient is to follow-up with neurology for her nerve conduction study, follow-up with MRI results and further recommendations regarding her headache management.  No prescriptions at this time.  Patient is given ED precautions to return to the ED for any worsening or new symptoms.     ____________________________________________  FINAL CLINICAL IMPRESSION(S) / ED DIAGNOSES  Final diagnoses:  Dizziness  Medication side effect  Weakness      NEW MEDICATIONS STARTED DURING THIS VISIT:  ED Discharge Orders    None          This chart was dictated using voice recognition software/Dragon. Despite best efforts to proofread, errors can occur which can change the meaning. Any change was purely unintentional.    Darletta Moll, PA-C 04/21/20 1331    Lavonia Drafts,  MD 04/21/20 1341

## 2020-04-21 NOTE — ED Triage Notes (Signed)
Pt to ER states she has felt dizzy and weak for last several days.  States today symptoms are worse and that she feels more "out of sorts" today.  Pt states this AM fell to the floor because he legs gave out form weakness this AM.  Pt states she started a new medication several weeks ago.

## 2020-05-10 DIAGNOSIS — R000 Tachycardia, unspecified: Secondary | ICD-10-CM | POA: Insufficient documentation

## 2020-06-06 ENCOUNTER — Ambulatory Visit (INDEPENDENT_AMBULATORY_CARE_PROVIDER_SITE_OTHER): Payer: Managed Care, Other (non HMO) | Admitting: Obstetrics and Gynecology

## 2020-06-06 ENCOUNTER — Encounter: Payer: Self-pay | Admitting: Obstetrics and Gynecology

## 2020-06-06 VITALS — BP 115/79 | HR 66 | Ht 66.0 in | Wt 223.6 lb

## 2020-06-06 DIAGNOSIS — Z01419 Encounter for gynecological examination (general) (routine) without abnormal findings: Secondary | ICD-10-CM | POA: Diagnosis not present

## 2020-06-06 NOTE — Progress Notes (Signed)
HPI:      Hannah Hudson is a 39 y.o. [phone] who LMP was Patient's last menstrual period was 05/28/2020.  Subjective:   She presents today for her annual examination.  She is having normal regular cycles.  She says 2 of 5 days are heavy.  She stopped OCPs because she did not notice a difference in her facial congestion/pressure symptoms. Husband has a vasectomy for birth control thus patient does not need birth control.  She is considering cycle control options however. Of significant note she has had a work-up by ENT for her facial congestion symptoms.  She has tried nighttime CPAP for possible sleep apnea, allergy shots, and various other things without success.    Hx: The following portions of the patient's history were reviewed and updated as appropriate:             She  has a past medical history of Elevated glucose tolerance test, Fetal size inconsistent with dates, preeclampsia, prior pregnancy, currently pregnant (2008), Morbid obesity with BMI of 40.0-44.9, adult ([place]), Prior fetal macrosomia, antepartum, PROM (premature rupture of membranes) (03/21/2015), and Urinary tract infection, recurrent. She does not have any pertinent problems on file. She  has a past surgical history that includes Appendectomy (2010); Laparoscopic bilateral salpingectomy (2010); and Cholecystectomy (2007). Her family history includes Bone cancer in her sister; Diabetes in her father and mother; Hypertension in her father and mother; Rheum arthritis in her mother. She  reports that she has never smoked. She has never used smokeless tobacco. She reports that she does not drink alcohol and does not use drugs. She has a current medication list which includes the following prescription(s): mirtazapine, omeprazole, and propranolol. She has No Known Allergies.       Review of Systems:  Review of Systems  Constitutional: Denied constitutional symptoms, night sweats, recent illness, fatigue, fever, insomnia and  weight loss.  Eyes: Denied eye symptoms, eye pain, photophobia, vision change and visual disturbance.  Ears/Nose/Throat/Neck: See HPI for additional information.  Cardiovascular: Denied cardiovascular symptoms, arrhythmia, chest pain/pressure, edema, exercise intolerance, orthopnea and palpitations.  Respiratory: Denied pulmonary symptoms, asthma, pleuritic pain, productive sputum, cough, dyspnea and wheezing.  Gastrointestinal: Denied, gastro-esophageal reflux, melena, nausea and vomiting.  Genitourinary: Denied genitourinary symptoms including symptomatic vaginal discharge, pelvic relaxation issues, and urinary complaints.  Musculoskeletal: Denied musculoskeletal symptoms, stiffness, swelling, muscle weakness and myalgia.  Dermatologic: Denied dermatology symptoms, rash and scar.  Neurologic: Denied neurology symptoms, dizziness, headache, neck pain and syncope.  Psychiatric: Denied psychiatric symptoms, anxiety and depression.  Endocrine: Denied endocrine symptoms including hot flashes and night sweats.   Meds:   Current Outpatient Medications on File Prior to Visit  Medication Sig Dispense Refill  . mirtazapine (REMERON) 7.5 MG tablet     . omeprazole (PRILOSEC OTC) 20 MG tablet Take by mouth.    . propranolol (INDERAL) 20 MG tablet Take by mouth.     No current facility-administered medications on file prior to visit.    Objective:     Vitals:   06/06/20 0904  BP: 115/79  Pulse: 66              Physical examination General NAD, Conversant  HEENT Atraumatic; Op clear with mmm.  Normo-cephalic. Pupils reactive. Anicteric sclerae  Thyroid/Neck Smooth without nodularity or enlargement. Normal ROM.  Neck Supple.  Skin No rashes, lesions or ulceration. Normal palpated skin turgor. No nodularity.  Breasts: No masses or discharge.  Symmetric.  No axillary adenopathy.  Lungs:  Clear to auscultation.No rales or wheezes. Normal Respiratory effort, no retractions.  Heart: NSR.  No  murmurs or rubs appreciated. No periferal edema  Abdomen: Soft.  Non-tender.  No masses.  No HSM. No hernia  Extremities: Moves all appropriately.  Normal ROM for age. No lymphadenopathy.  Neuro: Oriented to PPT.  Normal mood. Normal affect.     Pelvic:   Vulva: Normal appearance.  No lesions.  Vagina: No lesions or abnormalities noted.  Support: Normal pelvic support.  Urethra No masses tenderness or scarring.  Meatus Normal size without lesions or prolapse.  Cervix: Normal appearance.  No lesions.  Anus: Normal exam.  No lesions.  Perineum: Normal exam.  No lesions.        Bimanual   Uterus: Normal size.  Non-tender.  Mobile.  AV.  Adnexae: No masses.  Non-tender to palpation.  Cul-de-sac: Negative for abnormality.      Assessment:    [id number] Patient Active Problem List   Diagnosis Date Noted  . 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 Hyperlipidemia, mild 11/18/2017  . Sore throat, chronic 11/17/2016  . Adult BMI 31.0-31.9 kg/sq m 04/19/2015     1. Well woman exam with routine gynecological exam        Plan:            1.  Basic Screening Recommendations The basic screening recommendations for asymptomatic women were discussed with the patient during her visit.  The age-appropriate recommendations were discussed with her and the rational for the tests reviewed.  When I am informed by the patient that another primary care physician has previously obtained the age-appropriate tests and they are up-to-date, only outstanding tests are ordered and referrals given as necessary.  Abnormal results of tests will be discussed with her when all of her results are completed.  Routine preventative health maintenance measures emphasized: Exercise/Diet/Weight control, Tobacco Warnings, Alcohol/Substance use risks and Stress Management Patient considering IUD for cycle control. Orders No orders of the defined types were placed in this encounter.   No orders of the defined types were  placed in this encounter.           F/U  No follow-ups on file.  Finis Bud, M.D. 06/06/2020 9:38 AM

## 2020-07-23 ENCOUNTER — Other Ambulatory Visit (INDEPENDENT_AMBULATORY_CARE_PROVIDER_SITE_OTHER): Payer: Self-pay | Admitting: Vascular Surgery

## 2020-07-23 DIAGNOSIS — I739 Peripheral vascular disease, unspecified: Secondary | ICD-10-CM

## 2020-07-25 ENCOUNTER — Ambulatory Visit (INDEPENDENT_AMBULATORY_CARE_PROVIDER_SITE_OTHER): Payer: Managed Care, Other (non HMO) | Admitting: Vascular Surgery

## 2020-07-25 ENCOUNTER — Other Ambulatory Visit: Payer: Self-pay

## 2020-07-25 ENCOUNTER — Ambulatory Visit (INDEPENDENT_AMBULATORY_CARE_PROVIDER_SITE_OTHER): Payer: Managed Care, Other (non HMO)

## 2020-07-25 ENCOUNTER — Encounter (INDEPENDENT_AMBULATORY_CARE_PROVIDER_SITE_OTHER): Payer: Self-pay | Admitting: Vascular Surgery

## 2020-07-25 VITALS — BP 117/77 | HR 74 | Resp 16 | Ht 66.0 in | Wt 219.0 lb

## 2020-07-25 DIAGNOSIS — M79604 Pain in right leg: Secondary | ICD-10-CM

## 2020-07-25 DIAGNOSIS — I739 Peripheral vascular disease, unspecified: Secondary | ICD-10-CM | POA: Diagnosis not present

## 2020-07-25 DIAGNOSIS — M79605 Pain in left leg: Secondary | ICD-10-CM | POA: Diagnosis not present

## 2020-07-25 DIAGNOSIS — E785 Hyperlipidemia, unspecified: Secondary | ICD-10-CM | POA: Diagnosis not present

## 2020-07-26 ENCOUNTER — Encounter (INDEPENDENT_AMBULATORY_CARE_PROVIDER_SITE_OTHER): Payer: Self-pay | Admitting: Vascular Surgery

## 2020-07-26 DIAGNOSIS — M79606 Pain in leg, unspecified: Secondary | ICD-10-CM | POA: Insufficient documentation

## 2020-07-26 NOTE — Progress Notes (Signed)
MRN : [id number]  Hannah Hudson is a 39 y.o. (Nov 29, 1980) female who presents with chief complaint of  Chief Complaint  Patient presents with  . New Patient (Initial Visit)    Pad and ultrasound  .  History of Present Illness:   The patient is seen for evaluation of painful lower extremities. Patient notes the pain is variable and not usually associated with activity.  The pain is somewhat consistent day to day occurring on most days. She describes it as a severe burning/tingling like really bad pins and needles.  The patient notes the pain also occurs mostly with laying down and routinely seems worse as the day wears on. The pain has been progressive over the past several months. The patient states these symptoms are causing  a profound negative impact on quality of life and daily activities.  The patient denies rest pain or dangling of an extremity off the side of the bed during the night for relief. No open wounds or sores at this time. No history of DVT or phlebitis. No prior interventions or surgeries.  There is no history of back problems and DJD of the lumbar and sacral spine.  She did have a COVID infection months ago and feels this began after that episode  ABI's obtained today are normal bilaterally  Current Meds  Medication Sig  . cetirizine (ZYRTEC) 10 MG tablet Take by mouth.  . Cyanocobalamin 1000 MCG SUBL Place under the tongue.  Marland Kitchen EPINEPHrine 0.3 mg/0.3 mL IJ SOAJ injection USE AS DIRECTED DURING ANAPHYLACTIC REACTION  . fluticasone (FLONASE) 50 MCG/ACT nasal spray Place into the nose.  . mirtazapine (REMERON) 7.5 MG tablet   . Oxymetazoline HCl (NASAL SPRAY) 0.05 % SOLN Place into the nose.    Past Medical History:  Diagnosis Date  . Elevated glucose tolerance test    3hr wnl  . Fetal size inconsistent with dates    03/05/2015- growth scan today 86th percentile growth  . Hx of preeclampsia, prior pregnancy, currently pregnant 2008   11/14/2014- plan monitory  bp, serial u/s,third trimester antepartum testing, baseline serum creatinite  . Morbid obesity with BMI of 40.0-44.9, adult ([place])   . Prior fetal macrosomia, antepartum    limit weight gain 10-15pounds, early glucola  . PROM (premature rupture of membranes) 03/21/2015   returning neg; nitrazine ?, betamehasone 12.5mg  IM q 24h x2, afi tomorrow and weekly afi 03/22/2015-13.5cm  . Urinary tract infection, recurrent    11/14/2014- monthly urine cultures    Past Surgical History:  Procedure Laterality Date  . APPENDECTOMY  2010   laparoscopic  . CHOLECYSTECTOMY  2007  . LAPAROSCOPIC BILATERAL SALPINGECTOMY  2010   bilateral paratubal cyst excision    Social History Social History   Tobacco Use  . Smoking status: Never Smoker  . Smokeless tobacco: Never Used  Substance Use Topics  . Alcohol use: No    Alcohol/week: 0.0 standard drinks  . Drug use: No    Family History Family History  Problem Relation Age of Onset  . Bone cancer Sister   . Hypertension Mother   . Diabetes Mother   . Rheum arthritis Mother   . Hypertension Father   . Diabetes Father   No family history of bleeding/clotting disorders, porphyria or autoimmune disease   No Known Allergies   REVIEW OF SYSTEMS (Negative unless checked)  Constitutional: [] Weight loss  [] Fever  [] Chills Cardiac: [] Chest pain   [] Chest pressure   [] Palpitations   [] Shortness of breath  when laying flat   [] Shortness of breath with exertion. Vascular:  [] Pain in legs with walking   [x] Pain in legs at rest  [] History of DVT   [] Phlebitis   [] Swelling in legs   [] Varicose veins   [] Non-healing ulcers Pulmonary:   [] Uses home oxygen   [] Productive cough   [] Hemoptysis   [] Wheeze  [] COPD   [] Asthma Neurologic:  [] Dizziness   [] Seizures   [] History of stroke   [] History of TIA  [] Aphasia   [] Vissual changes   [] Weakness or numbness in arm   [] Weakness or numbness in leg Musculoskeletal:   [] Joint swelling   [] Joint pain   [] Low back  pain Hematologic:  [] Easy bruising  [] Easy bleeding   [] Hypercoagulable state   [] Anemic Gastrointestinal:  [] Diarrhea   [] Vomiting  [] Gastroesophageal reflux/heartburn   [] Difficulty swallowing. Genitourinary:  [] Chronic kidney disease   [] Difficult urination  [] Frequent urination   [] Blood in urine Skin:  [] Rashes   [] Ulcers  Psychological:  [] History of anxiety   []  History of major depression.  Physical Examination  Vitals:   07/25/20 0934  BP: 117/77  Pulse: 74  Resp: 16  Weight: 219 lb (99.3 kg)  Height: 5\' 6"  (1.676 m)   Body mass index is 35.35 kg/m. Gen: WD/WN, NAD Head: [place]/AT, No temporalis wasting.  Ear/Nose/Throat: Hearing grossly intact, nares w/o erythema or drainage, poor dentition Eyes: PER, EOMI, sclera nonicteric.  Neck: Supple, no masses.  No bruit or JVD.  Pulmonary:  Good air movement, clear to auscultation bilaterally, no use of accessory muscles.  Cardiac: RRR, normal S1, S2, no Murmurs. Vascular: mild edema bilateral ankles Vessel Right Left  Radial Palpable Palpable  PT Palpable Palpable  DP Palpable Palpable  Gastrointestinal: soft, non-distended. No guarding/no peritoneal signs.  Musculoskeletal: M/S 5/5 throughout.  No deformity or atrophy.  Neurologic: CN 2-12 intact. Pain and light touch intact in extremities.  Symmetrical.  Speech is fluent. Motor exam as listed above. Psychiatric: Judgment intact, Mood & affect appropriate for pt's clinical situation. Dermatologic: No rashes or ulcers noted.  No changes consistent with cellulitis.  CBC Lab Results  Component Value Date   WBC 7.9 04/21/2020   HGB 12.0 04/21/2020   HCT 37.1 04/21/2020   MCV 83.2 04/21/2020   PLT 290 04/21/2020    BMET    Component Value Date/Time   NA 138 04/21/2020 1136   NA 139 05/01/2013 0342   K 3.9 04/21/2020 1136   K 4.2 05/01/2013 0342   CL 108 04/21/2020 1136   CL 108 (H) 05/01/2013 0342   CO2 25 04/21/2020 1136   CO2 21 05/01/2013 0342   GLUCOSE 108 (H)  04/21/2020 1136   GLUCOSE 99 05/01/2013 0342   BUN 15 04/21/2020 1136   BUN 9 05/01/2013 0342   CREATININE 0.93 04/21/2020 1136   CREATININE 0.62 05/01/2013 0342   CALCIUM 8.9 04/21/2020 1136   CALCIUM 8.6 05/01/2013 0342   GFRNONAA >60 04/21/2020 1136   GFRNONAA >60 05/01/2013 0342   GFRAA >60 04/21/2020 1136   GFRAA >60 05/01/2013 0342   CrCl cannot be calculated (Patient's most recent lab result is older than the maximum 21 days allowed.).  COAG No results found for: INR, PROTIME  Radiology ABI WITH/WO TBI  Result Date: 07/25/2020 LOWER EXTREMITY DOPPLER STUDY Indications: Leg tingling and numbness.  Performing Technologist: Charlane Ferretti RT (R)(VS)  Examination Guidelines: A complete evaluation includes at minimum, Doppler waveform signals and systolic blood pressure reading at the level of bilateral brachial,  anterior tibial, and posterior tibial arteries, when vessel segments are accessible. Bilateral testing is considered an integral part of a complete examination. Photoelectric Plethysmograph (PPG) waveforms and toe systolic pressure readings are included as required and additional duplex testing as needed. Limited examinations for reoccurring indications may be performed as noted.  ABI Findings: +---------+------------------+-----+---------+--------+ Right    Rt Pressure (mmHg)IndexWaveform Comment  +---------+------------------+-----+---------+--------+ Brachial 122                                      +---------+------------------+-----+---------+--------+ ATA      154               1.17 triphasic         +---------+------------------+-----+---------+--------+ PTA      170               1.29 triphasic         +---------+------------------+-----+---------+--------+ Great Toe136               1.03 Normal            +---------+------------------+-----+---------+--------+ +---------+------------------+-----+---------+-------+ Left     Lt Pressure  (mmHg)IndexWaveform Comment +---------+------------------+-----+---------+-------+ Brachial 132                                     +---------+------------------+-----+---------+-------+ ATA      169               1.28 triphasic        +---------+------------------+-----+---------+-------+ PTA      168               1.27 triphasic        +---------+------------------+-----+---------+-------+ Great Toe150               1.14 Normal           +---------+------------------+-----+---------+-------+ Summary: Right: Resting right ankle-brachial index is within normal range. No evidence of significant right lower extremity arterial disease. The right toe-brachial index is normal. Left: Resting left ankle-brachial index is within normal range. No evidence of significant left lower extremity arterial disease. The left toe-brachial index is normal. *See table(s) above for measurements and observations.  Electronically signed by Hortencia Pilar MD on 07/25/2020 at 12:09:47 PM.   Final       Assessment/Plan 1. Pain in both lower extremities Recommend:  I do not find evidence of Vascular pathology that would explain the patient's symptoms  The patient describes atypical pain symptoms for vascular disease  I do not find evidence of Vascular pathology that would explain the patient's symptoms and I suspect the patient is c/o pain with a neuropathic origin.  Patient should have an evaluation of his LS spine which I defer to the primary service.  Noninvasive studies do not identify vascular problems  The patient should continue walking and begin a more formal exercise program. The patient should continue his antiplatelet therapy and aggressive treatment of the lipid abnormalities. The patient should begin wearing graduated compression socks 15-20 mmHg strength to control her mild edema.  Patient will follow-up with me on a PRN basis  Further work-up of her lower extremity pain is  deferred to the primary service     2. Hyperlipidemia, mild Continue statin as ordered and reviewed, no changes at this time    Hortencia Pilar, MD  07/26/2020 9:59 AM

## 2020-09-05 ENCOUNTER — Other Ambulatory Visit (HOSPITAL_COMMUNITY): Payer: Self-pay | Admitting: Orthopedic Surgery

## 2020-09-05 ENCOUNTER — Other Ambulatory Visit: Payer: Self-pay | Admitting: Orthopedic Surgery

## 2020-09-05 DIAGNOSIS — M4802 Spinal stenosis, cervical region: Secondary | ICD-10-CM

## 2020-09-09 ENCOUNTER — Ambulatory Visit
Admission: RE | Admit: 2020-09-09 | Discharge: 2020-09-09 | Disposition: A | Discharge status: Home / Self Care | Payer: Managed Care, Other (non HMO) | Source: Ambulatory Visit | Attending: Orthopedic Surgery | Admitting: Orthopedic Surgery

## 2020-09-09 ENCOUNTER — Other Ambulatory Visit: Payer: Self-pay

## 2020-09-09 DIAGNOSIS — M4802 Spinal stenosis, cervical region: Secondary | ICD-10-CM

## 2020-09-19 ENCOUNTER — Other Ambulatory Visit: Payer: Self-pay

## 2020-09-19 ENCOUNTER — Encounter: Payer: Self-pay | Admitting: Otolaryngology

## 2020-09-24 ENCOUNTER — Other Ambulatory Visit
Admission: RE | Admit: 2020-09-24 | Discharge: 2020-09-24 | Disposition: A | Discharge status: Home / Self Care | Payer: Managed Care, Other (non HMO) | Source: Ambulatory Visit | Attending: Otolaryngology | Admitting: Otolaryngology

## 2020-09-24 ENCOUNTER — Other Ambulatory Visit: Payer: Self-pay

## 2020-09-24 DIAGNOSIS — Z01812 Encounter for preprocedural laboratory examination: Secondary | ICD-10-CM | POA: Diagnosis present

## 2020-09-24 DIAGNOSIS — Z20822 Contact with and (suspected) exposure to covid-19: Secondary | ICD-10-CM | POA: Diagnosis not present

## 2020-09-25 LAB — SARS CORONAVIRUS 2 (TAT 6-24 HRS): SARS Coronavirus 2: NEGATIVE

## 2020-09-26 ENCOUNTER — Ambulatory Visit
Admission: RE | Admit: 2020-09-26 | Discharge: 2020-09-26 | Disposition: A | Discharge status: Home / Self Care | Payer: Managed Care, Other (non HMO) | Attending: Otolaryngology | Admitting: Otolaryngology

## 2020-09-26 ENCOUNTER — Ambulatory Visit: Payer: Managed Care, Other (non HMO) | Admitting: Anesthesiology

## 2020-09-26 ENCOUNTER — Encounter: Payer: Self-pay | Admitting: Otolaryngology

## 2020-09-26 ENCOUNTER — Other Ambulatory Visit: Payer: Self-pay

## 2020-09-26 ENCOUNTER — Encounter
Admission: RE | Disposition: A | Discharge status: Home / Self Care | Payer: Self-pay | Source: Home / Self Care | Attending: Otolaryngology

## 2020-09-26 DIAGNOSIS — J343 Hypertrophy of nasal turbinates: Secondary | ICD-10-CM | POA: Insufficient documentation

## 2020-09-26 DIAGNOSIS — J3489 Other specified disorders of nose and nasal sinuses: Secondary | ICD-10-CM | POA: Diagnosis not present

## 2020-09-26 DIAGNOSIS — J342 Deviated nasal septum: Secondary | ICD-10-CM | POA: Insufficient documentation

## 2020-09-26 DIAGNOSIS — Z79899 Other long term (current) drug therapy: Secondary | ICD-10-CM | POA: Diagnosis not present

## 2020-09-26 HISTORY — DX: Tachycardia, unspecified: R00.0

## 2020-09-26 HISTORY — DX: Sleep apnea, unspecified: G47.30

## 2020-09-26 HISTORY — PX: SEPTOPLASTY: SHX2393

## 2020-09-26 HISTORY — DX: Other complications of anesthesia, initial encounter: T88.59XA

## 2020-09-26 HISTORY — DX: Gastro-esophageal reflux disease without esophagitis: K21.9

## 2020-09-26 HISTORY — DX: Family history of other specified conditions: Z84.89

## 2020-09-26 HISTORY — DX: Dizziness and giddiness: R42

## 2020-09-26 HISTORY — PX: TURBINATE REDUCTION: SHX6157

## 2020-09-26 LAB — POCT PREGNANCY, URINE: Preg Test, Ur: NEGATIVE

## 2020-09-26 SURGERY — SEPTOPLASTY, NOSE
Anesthesia: General | Site: Nose | Laterality: Bilateral

## 2020-09-26 MED ORDER — ONDANSETRON HCL 4 MG/2ML IJ SOLN
INTRAMUSCULAR | Status: DC | PRN
  Administered 2020-09-26: 4 mg via INTRAVENOUS

## 2020-09-26 MED ORDER — ONDANSETRON HCL 4 MG/2ML IJ SOLN
4.0000 mg | Freq: Once | INTRAMUSCULAR | Status: AC | PRN
  Administered 2020-09-26: 4 mg via INTRAVENOUS

## 2020-09-26 MED ORDER — MIDAZOLAM HCL 5 MG/5ML IJ SOLN
INTRAMUSCULAR | Status: DC | PRN
  Administered 2020-09-26: 2 mg via INTRAVENOUS

## 2020-09-26 MED ORDER — GLYCOPYRROLATE 0.2 MG/ML IJ SOLN
INTRAMUSCULAR | Status: DC | PRN
  Administered 2020-09-26: .1 mg via INTRAVENOUS

## 2020-09-26 MED ORDER — TRAMADOL HCL 50 MG PO TABS: 50.0000 mg | ORAL_TABLET | Freq: Once | ORAL | Status: DC

## 2020-09-26 MED ORDER — ACETAMINOPHEN 160 MG/5ML PO SOLN: 325.0000 mg | ORAL | Status: DC | PRN

## 2020-09-26 MED ORDER — OXYMETAZOLINE HCL 0.05 % NA SOLN
1.0000 | Freq: Once | NASAL | Status: AC
  Administered 2020-09-26: 1 via NASAL

## 2020-09-26 MED ORDER — PREDNISONE 10 MG PO TABS: ORAL_TABLET | ORAL | 0 refills | Status: DC

## 2020-09-26 MED ORDER — CEFAZOLIN SODIUM-DEXTROSE 2-4 GM/100ML-% IV SOLN
2.0000 g | Freq: Once | INTRAVENOUS | Status: AC
  Administered 2020-09-26: 2 g via INTRAVENOUS

## 2020-09-26 MED ORDER — DEXAMETHASONE SODIUM PHOSPHATE 4 MG/ML IJ SOLN
INTRAMUSCULAR | Status: DC | PRN
  Administered 2020-09-26: 10 mg via INTRAVENOUS

## 2020-09-26 MED ORDER — ACETAMINOPHEN 10 MG/ML IV SOLN
1000.0000 mg | Freq: Once | INTRAVENOUS | Status: AC
  Administered 2020-09-26: 1000 mg via INTRAVENOUS

## 2020-09-26 MED ORDER — FENTANYL CITRATE (PF) 100 MCG/2ML IJ SOLN
INTRAMUSCULAR | Status: DC | PRN
  Administered 2020-09-26: 100 ug via INTRAVENOUS

## 2020-09-26 MED ORDER — HYDROMORPHONE HCL 1 MG/ML IJ SOLN: 0.2500 mg | INTRAMUSCULAR | Status: DC | PRN

## 2020-09-26 MED ORDER — LIDOCAINE HCL (CARDIAC) PF 100 MG/5ML IV SOSY
PREFILLED_SYRINGE | INTRAVENOUS | Status: DC | PRN
  Administered 2020-09-26: 50 mg via INTRAVENOUS

## 2020-09-26 MED ORDER — ACETAMINOPHEN 325 MG PO TABS: 325.0000 mg | ORAL_TABLET | ORAL | Status: DC | PRN

## 2020-09-26 MED ORDER — LIDOCAINE-EPINEPHRINE 1 %-1:100000 IJ SOLN
INTRAMUSCULAR | Status: DC | PRN
  Administered 2020-09-26: 7 mL
  Administered 2020-09-26: 1.5 mL

## 2020-09-26 MED ORDER — PHENYLEPHRINE HCL 0.5 % NA SOLN
NASAL | Status: DC | PRN
  Administered 2020-09-26: 15 mL via TOPICAL

## 2020-09-26 MED ORDER — SCOPOLAMINE 1 MG/3DAYS TD PT72
1.0000 | MEDICATED_PATCH | TRANSDERMAL | Status: DC
  Administered 2020-09-26: 1.5 mg via TRANSDERMAL

## 2020-09-26 MED ORDER — LACTATED RINGERS IV SOLN: INTRAVENOUS | Status: DC

## 2020-09-26 MED ORDER — SUCCINYLCHOLINE CHLORIDE 20 MG/ML IJ SOLN
INTRAMUSCULAR | Status: DC | PRN
  Administered 2020-09-26: 100 mg via INTRAVENOUS

## 2020-09-26 MED ORDER — OXYCODONE HCL 5 MG/5ML PO SOLN
5.0000 mg | Freq: Once | ORAL | Status: AC | PRN
  Administered 2020-09-26: 5 mg via ORAL

## 2020-09-26 MED ORDER — TRAMADOL HCL 50 MG PO TABS
ORAL_TABLET | ORAL | 0 refills | Status: DC
Start: 2020-09-26 — End: 2022-02-19

## 2020-09-26 MED ORDER — PROPOFOL 10 MG/ML IV BOLUS
INTRAVENOUS | Status: DC | PRN
  Administered 2020-09-26: 150 mg via INTRAVENOUS

## 2020-09-26 MED ORDER — OXYCODONE HCL 5 MG PO TABS: 5.0000 mg | ORAL_TABLET | Freq: Once | ORAL | Status: AC | PRN

## 2020-09-26 MED ORDER — CEPHALEXIN 500 MG PO CAPS: 500.0000 mg | ORAL_CAPSULE | Freq: Two times a day (BID) | ORAL | 0 refills | Status: DC

## 2020-09-26 SURGICAL SUPPLY — 30 items
CANISTER SUCT 1200ML W/VALVE (MISCELLANEOUS) ×2 IMPLANT
COAGULATOR SUCT 8FR VV (MISCELLANEOUS) ×2 IMPLANT
ELECT REM PT RETURN 9FT ADLT (ELECTROSURGICAL) ×2
ELECTRODE REM PT RTRN 9FT ADLT (ELECTROSURGICAL) ×1 IMPLANT
GEL ULTRASOUND 20GR AQUASONIC (MISCELLANEOUS) ×2 IMPLANT
GLOVE PI ULTRA LF STRL 7.5 (GLOVE) ×3 IMPLANT
GLOVE PI ULTRA NON LATEX 7.5 (GLOVE) ×3
GOWN STRL REUS W/ TWL LRG LVL3 (GOWN DISPOSABLE) ×1 IMPLANT
GOWN STRL REUS W/TWL LRG LVL3 (GOWN DISPOSABLE) ×2
KIT TURNOVER KIT A (KITS) ×2 IMPLANT
NEEDLE ANESTHESIA  27G X 3.5 (NEEDLE) ×1
NEEDLE ANESTHESIA 27G X 3.5 (NEEDLE) ×1 IMPLANT
NEEDLE HYPO 25GX1X1/2 BEV (NEEDLE) ×2 IMPLANT
NEEDLE HYPO 27GX1-1/4 (NEEDLE) ×2 IMPLANT
PACK ENT CUSTOM (PACKS) ×2 IMPLANT
PATTIES SURGICAL .5 X3 (DISPOSABLE) ×2 IMPLANT
SOL ANTI-FOG 6CC FOG-OUT (MISCELLANEOUS) ×1 IMPLANT
SOL FOG-OUT ANTI-FOG 6CC (MISCELLANEOUS) ×1
SPLINT NASAL SEPTAL BLV .50 ST (MISCELLANEOUS) ×2 IMPLANT
STRAP BODY AND KNEE 60X3 (MISCELLANEOUS) ×2 IMPLANT
STYLUS VIVAER (MISCELLANEOUS) ×4
STYLUS VIVAER BP ELECT (MISCELLANEOUS) ×2 IMPLANT
SUT CHROMIC 3-0 (SUTURE) ×2
SUT CHROMIC 3-0 KS 27XMFL CR (SUTURE) ×1
SUT ETHILON 3-0 KS 30 BLK (SUTURE) ×2 IMPLANT
SUT PLAIN GUT 4-0 (SUTURE) ×2 IMPLANT
SUTURE CHRMC 3-0 KS 27XMFL CR (SUTURE) ×1 IMPLANT
SYR 3ML LL SCALE MARK (SYRINGE) ×2 IMPLANT
TOWEL OR 17X26 4PK STRL BLUE (TOWEL DISPOSABLE) ×2 IMPLANT
WATER STERILE IRR 250ML POUR (IV SOLUTION) ×2 IMPLANT

## 2020-09-26 NOTE — Op Note (Signed)
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  She has narrow nasal valves that collapse with inspiration.  Procedure: With the patient in a comfortable supine position,  general orotracheal anesthesia was induced without difficulty.     The patient received preoperative Afrin spray for topical decongestion and vasoconstriction.  Intravenous prophylactic antibiotics were administered.  At an appropriate level, the patient was placed in a semi-sitting position.  Nasal vibrissae were trimmed.   1% Xylocaine with 1:100,000 epinephrine, 7 cc's, was infiltrated into the anterior floor of the nose, into the nasal spine region, into the membranous columella, and finally into the submucoperichondrial plane of the septum on both sides.  Several minutes were allowed for this to take effect.  Cottoniod pledgetts soaked in Afrin and 4% Xylocaine were placed into both nasal cavities and left while the patient was prepped and draped in the standard fashion.  The materials were removed from the nose and observed to be intact and correct in number.  The nose was inspected with a headlight and zero degree scope with the findings as described above.  A left Killian incision was sharply executed and carried down to the quadrangular cartilage. The mucoperichondrium was elelvated along the quadrangular plate back to the bony-cartilaginous junction. The mucoperiostium was then elevated along the ethmoid plate and the vomer. The  boney-catilaginous junction was then split with a freer elevator and the mucoperiosteum was elevated on the opposite side. The mucoperiosteum was then elevated along the maxillary crest as needed to expose the crooked bone of the crest.  Boney spurs of the vomer and maxillary crest were removed with Donavan Foil forceps.  A chisel was used to free up some of the real crooked maxillary crest I did buckled towards her left side.  The cartilaginous plate was trimmed along its posterior and inferior borders of about 2 mm of cartilage to free it up inferiorly. Some of the deviated ethmoid plate was then fractured and removed with Takahashi forceps to free up the posterior border of the quadrangular plate and allow it to swing back to the midline. The mucosal flaps were placed back into their anatomic position to allow visualization of the airways. The septum now sat in the midline with an improved airway.  A 3-0 Chromic suture on a Keith needle in used to anchor the inferior septum at the nasal spine with a through and through suture. The mucosal flaps are then sutured together using a through and through whip stitch of 4-0 Plain Gut with a mini-Keith needle. This was used to close the [place] incision as well.   The inferior turbinates were then inspected. An incision was created along the inferior aspect of the right inferior turbinate with removal of some of the inferior soft tissue and bone. Electrocautery was used to control bleeding in the area. The remaining turbinate was then outfractured to open up the airway further. There was no significant bleeding noted. The leftt turbinate was then trimmed and outfractured in a similar fashion.  0 degree scope was used to visualize  the right middle turbinate that was enlarged as well.  There was loose soft tissue along its anterior inferior border and medial border.  Some of this was trimmed and cauterized with electrocautery.  The airways were then visualized and  showed open passageways on both sides that were significantly improved compared to before surgery. There was no signifcant bleeding. Nasal splints were applied to both sides of the septum using Xomed 0.15mm regular sized splints that were trimmed, and then held in position with a 3-0 Nylon through and through suture.  The nasal valves were then visualized and these were narrow and hanging down into the nasal airway on the lateral superior side bilaterally.  Combined total of 1-1/2 mL of 1% Xylocaine with epi 1:100,000 was used for infiltration into the nasal valve on each side.  The Vivaer machine was brought in and the wand was applied to it.  The line was lubricated with gel and then placed in the left upper nasal valve area.  Energy was applied for 18 seconds with a 12-second cooldown period.  This was done then in the midportion of the lateral nasal valve and then the inferior portion of the lateral nasal valve.  Once these were completed you could see the nasal valve to be open more on the left side.  This was then repeated on the right side starting at the upper portion of the lateral nasal valve wall and then moving downward with 3 consecutive applications of energy.  The nasal valve on this side was then noted to be remodeled and more open as well.  The patient was turned back over to anesthesia, and awakened, extubated, and taken to the PACU in satisfactory condition.  She tolerated the procedure well.  There were no operative complications.  Dispo:   PACU to home  Plan: Ice, elevation, narcotic analgesia, steroid taper, and prophylactic antibiotics for the duration of indwelling nasal foreign bodies.  We will reevaluate the patient in the office in 6 days and remove the septal splints.  Return to work in 10 days, strenuous activities in two weeks.   Hannah Alas Hannah Hudson 09/26/2020 2:19 PM

## 2020-09-26 NOTE — Anesthesia Procedure Notes (Signed)
Procedure Name: Intubation Date/Time: 09/26/2020 1:14 PM Performed by: Mayme Genta, CRNA Pre-anesthesia Checklist: Patient identified, Emergency Drugs available, Suction available, Patient being monitored and Timeout performed Patient Re-evaluated:Patient Re-evaluated prior to induction Oxygen Delivery Method: Circle system utilized Preoxygenation: Pre-oxygenation with 100% oxygen Induction Type: IV induction Ventilation: Mask ventilation without difficulty Laryngoscope Size: Mac and 3 Grade View: Grade I Tube type: Oral Rae Tube size: 7.0 mm Number of attempts: 1 Placement Confirmation: ETT inserted through vocal cords under direct vision,  positive ETCO2 and breath sounds checked- equal and bilateral Secured at: 21 cm Tube secured with: Tape Dental Injury: Teeth and Oropharynx as per pre-operative assessment

## 2020-09-26 NOTE — H&P (Signed)
H&P has been reviewed and patient reevaluated, no changes necessary. To be downloaded later.  

## 2020-09-26 NOTE — Discharge Instructions (Signed)
[facility] ENDOSCOPIC SINUS SURGERY [facility]  What is Functional Endoscopic Sinus Surgery?  The Surgery involves making the natural openings of the sinuses larger by removing the bony partitions that separate the sinuses from the nasal cavity.  The natural sinus lining is preserved as much as possible to allow the sinuses to resume normal function after the surgery.  In some patients nasal polyps (excessively swollen lining of the sinuses) may be removed to relieve obstruction of the sinus openings.  The surgery is performed through the nose using lighted scopes, which eliminates the need for incisions on the face.  A septoplasty is a different procedure which is sometimes performed with sinus surgery.  It involves straightening the boy partition that separates the two sides of your nose.  A crooked or deviated septum may need repair if is obstructing the sinuses or nasal airflow.  Turbinate reduction is also often performed during sinus surgery.  The turbinates are bony proturberances from the side walls of the nose which swell and can obstruct the nose in patients with sinus and allergy problems.  Their size can be surgically reduced to help relieve nasal obstruction.  What Can Sinus Surgery Do For Me?  Sinus surgery can reduce the frequency of sinus infections requiring antibiotic treatment.  This can provide improvement in nasal congestion, post-nasal drainage, facial pressure and nasal obstruction.  Surgery will NOT prevent you from ever having an infection again, so it usually only for patients who get infections 4 or more times yearly requiring antibiotics, or for infections that do not clear with antibiotics.  It will not cure nasal allergies, so patients with allergies may still require medication to treat their allergies after surgery. Surgery may improve headaches related to sinusitis, however, some people will continue to  require medication to control sinus headaches related to allergies.  Surgery will do nothing for other forms of headache (migraine, tension or cluster).  What Are the Risks of Endoscopic Sinus Surgery?  Current techniques allow surgery to be performed safely with little risk, however, there are rare complications that patients should be aware of.  Because the sinuses are located around the eyes, there is risk of eye injury, including blindness, though again, this would be quite rare. This is usually a result of bleeding behind the eye during surgery, which puts the vision oat risk, though there are treatments to protect the vision and prevent permanent disrupted by surgery causing a leak of the spinal fluid that surrounds the brain.  More serious complications would include bleeding inside the brain cavity or damage to the brain.  Again, all of these complications are uncommon, and spinal fluid leaks can be safely managed surgically if they occur.  The most common complication of sinus surgery is bleeding from the nose, which may require packing or cauterization of the nose.  Continued sinus have polyps may experience recurrence of the polyps requiring revision surgery.  Alterations of sense of smell or injury to the tear ducts are also rare complications.   What is the Surgery Like, and what is the Recovery?  The Surgery usually takes a couple of hours to perform, and is usually performed under a general anesthetic (completely asleep).  Patients are usually discharged home after a couple of hours.  Sometimes during surgery it is necessary to pack the nose to control bleeding, and the packing is left in place for 24 - 48 hours, and removed by your surgeon.    If a septoplasty was performed during the procedure, there is often a splint placed which must be removed after 5-7 days.   Discomfort: Pain is usually mild to moderate, and can be controlled by prescription pain medication or acetaminophen (Tylenol).   Aspirin, Ibuprofen (Advil, Motrin), or Naprosyn (Aleve) should be avoided, as they can cause increased bleeding.  Most patients feel sinus pressure like they have a bad head cold for several days.  Sleeping with your head elevated can help reduce swelling and facial pressure, as can ice packs over the face.  A humidifier may be helpful to keep the mucous and blood from drying in the nose.   Diet: There are no specific diet restrictions, however, you should generally start with clear liquids and a light diet of bland foods because the anesthetic can cause some nausea.  Advance your diet depending on how your stomach feels.  Taking your pain medication with food will often help reduce stomach upset which pain medications can cause.  Nasal Saline Irrigation: It is important to remove blood clots and dried mucous from the nose as it is healing.  This is done by having you irrigate the nose at least 3 - 4 times daily with a salt water solution.  We recommend using NeilMed Sinus Rinse (available at the drug store).  Fill the squeeze bottle with the solution, bend over a sink, and insert the tip of the squeeze bottle into the nose  of an inch.  Point the tip of the squeeze bottle towards the inside corner of the eye on the same side your irrigating.  Squeeze the bottle and gently irrigate the nose.  If you bend forward as you do this, most of the fluid will flow back out of the nose, instead of down your throat.   The solution should be warm, near body temperature, when you irrigate.   Each time you irrigate, you should use a full squeeze bottle.   Note that if you are instructed to use Nasal Steroid Sprays at any time after your surgery, irrigate with saline BEFORE using the steroid spray, so you do not wash it all out of the nose. Another product, Nasal Saline Gel (such as AYR Nasal Saline Gel) can be applied in each nostril 3 - 4 times daily to moisture the nose and reduce scabbing or crusting.  Bleeding:   Bloody drainage from the nose can be expected for several days, and patients are instructed to irrigate their nose frequently with salt water to help remove mucous and blood clots.  The drainage may be dark red or brown, though some fresh blood may be seen intermittently, especially after irrigation.  Do not blow you nose, as bleeding may occur. If you must sneeze, keep your mouth open to allow air to escape through your mouth.  If heavy bleeding occurs: Irrigate the nose with saline to rinse out clots, then spray the nose 3 - 4 times with Afrin Nasal Decongestant Spray.  The spray will constrict the blood vessels to slow bleeding.  Pinch the lower half of your nose shut to apply pressure, and lay down with your head elevated.  Ice packs over the nose may help as well. If bleeding persists despite these measures, you should notify your doctor.  Do not use the Afrin routinely to control nasal congestion after surgery, as it can result in worsening congestion and may affect healing.   Activity: Return to work varies among patients. Most patients will be out of  work at least 5 - 7 days to recover.  Patient may return to work after they are off of narcotic pain medication, and feeling well enough to perform the functions of their job.  Patients must avoid heavy lifting (over 10 pounds) or strenuous physical for 2 weeks after surgery, so your employer may need to assign you to light duty, or keep you out of work longer if light duty is not possible.  NOTE: you should not drive, operate dangerous machinery, do any mentally demanding tasks or make any important legal or financial decisions while on narcotic pain medication and recovering from the general anesthetic.    Call Your Doctor Immediately if You Have Any of the Following: 1. Bleeding that you cannot control with the above measures 2. Loss of vision, double vision, bulging of the eye or black eyes. 3. Fever over 101 degrees 4. Neck stiffness with severe  headache, fever, nausea and change in mental state. You are always encourage to call anytime with concerns, however, please call with requests for pain medication refills during office hours.  Office Endoscopy: During follow-up visits your doctor will remove any packing or splints that may have been placed and evaluate and clean your sinuses endoscopically.  Topical anesthetic will be used to make this as comfortable as possible, though you may want to take your pain medication prior to the visit.  How often this will need to be done varies from patient to patient.  After complete recovery from the surgery, you may need follow-up endoscopy from time to time, particularly if there is concern of recurrent infection or nasal polyps.  General Anesthesia, Adult, Care After This sheet gives you information about how to care for yourself after your procedure. Your health care provider may also give you more specific instructions. If you have problems or questions, contact your health care provider. What can I expect after the procedure? After the procedure, the following side effects are common:  Pain or discomfort at the IV site.  Nausea.  Vomiting.  Sore throat.  Trouble concentrating.  Feeling cold or chills.  Weak or tired.  Sleepiness and fatigue.  Soreness and body aches. These side effects can affect parts of the body that were not involved in surgery. Follow these instructions at home:  For at least 24 hours after the procedure:  Have a responsible adult stay with you. It is important to have someone help care for you until you are awake and alert.  Rest as needed.  Do not: ? Participate in activities in which you could fall or become injured. ? Drive. ? Use heavy machinery. ? Drink alcohol. ? Take sleeping pills or medicines that cause drowsiness. ? Make important decisions or sign legal documents. ? Take care of children on your own. Eating and drinking  Follow any  instructions from your health care provider about eating or drinking restrictions.  When you feel hungry, start by eating small amounts of foods that are soft and easy to digest (bland), such as toast. Gradually return to your regular diet.  Drink enough fluid to keep your urine pale yellow.  If you vomit, rehydrate by drinking water, juice, or clear broth. General instructions  If you have sleep apnea, surgery and certain medicines can increase your risk for breathing problems. Follow instructions from your health care provider about wearing your sleep device: ? Anytime you are sleeping, including during daytime naps. ? While taking prescription pain medicines, sleeping medicines, or medicines that make you  drowsy.  Return to your normal activities as told by your health care provider. Ask your health care provider what activities are safe for you.  Take over-the-counter and prescription medicines only as told by your health care provider.  If you smoke, do not smoke without supervision.  Keep all follow-up visits as told by your health care provider. This is important. Contact a health care provider if:  You have nausea or vomiting that does not get better with medicine.  You cannot eat or drink without vomiting.  You have pain that does not get better with medicine.  You are unable to pass urine.  You develop a skin rash.  You have a fever.  You have redness around your IV site that gets worse. Get help right away if:  You have difficulty breathing.  You have chest pain.  You have blood in your urine or stool, or you vomit blood. Summary  After the procedure, it is common to have a sore throat or nausea. It is also common to feel tired.  Have a responsible adult stay with you for the first 24 hours after general anesthesia. It is important to have someone help care for you until you are awake and alert.  When you feel hungry, start by eating small amounts of foods  that are soft and easy to digest (bland), such as toast. Gradually return to your regular diet.  Drink enough fluid to keep your urine pale yellow.  Return to your normal activities as told by your health care provider. Ask your health care provider what activities are safe for you. This information is not intended to replace advice given to you by your health care provider. Make sure you discuss any questions you have with your health care provider. Document Revised: 10/29/2017 Document Reviewed: 06/11/2017 Elsevier Patient Education  [place].  Scopolamine skin patches Remove in 72 hrs. Wash hands immediately after removal. What is this medicine? SCOPOLAMINE (skoe POL a meen) is used to prevent nausea and vomiting caused by motion sickness, anesthesia and surgery. This medicine may be used for other purposes; ask your health care provider or pharmacist if you have questions. COMMON BRAND NAME(S): Transderm Scop What should I tell my health care provider before I take this medicine? They need to know if you have any of these conditions:  are scheduled to have a gastric secretion test  glaucoma  heart disease  kidney disease  liver disease  lung or breathing disease, like asthma  mental illness  prostate disease  seizures  stomach or intestine problems  trouble passing urine  an unusual or allergic reaction to scopolamine, atropine, other medicines, foods, dyes, or preservatives  pregnant or trying to get pregnant  breast-feeding How should I use this medicine? This medicine is for external use only. Follow the directions on the prescription label. Wear only 1 patch at a time. Choose an area behind the ear, that is clean, dry, hairless and free from any cuts or irritation. Wipe the area with a clean dry tissue. Peel off the plastic backing of the skin patch, trying not to touch the adhesive side with your hands. Do not cut the patches. Firmly apply to the area  you have chosen, with the metallic side of the patch to the skin and the tan-colored side showing. Once firmly in place, wash your hands well with soap and water. Do not get this medicine into your eyes. After removing the patch, wash your hands and the  area behind your ear thoroughly with soap and water. The patch will still contain some medicine after use. To avoid accidental contact or ingestion by children or pets, fold the used patch in half with the sticky side together and throw away in the trash out of the reach of children and pets. If you need to use a second patch after you remove the first, place it behind the other ear. A special MedGuide will be given to you by the pharmacist with each prescription and refill. Be sure to read this information carefully each time. Talk to your pediatrician regarding the use of this medicine in children. Special care may be needed. Overdosage: If you think you have taken too much of this medicine contact a poison control center or emergency room at once. NOTE: This medicine is only for you. Do not share this medicine with others. What if I miss a dose? This does not apply. This medicine is not for regular use. What may interact with this medicine?  alcohol  antihistamines for allergy cough and cold  atropine  certain medicines for anxiety or sleep  certain medicines for bladder problems like oxybutynin, tolterodine  certain medicines for depression like amitriptyline, fluoxetine, sertraline  certain medicines for stomach problems like dicyclomine, hyoscyamine  certain medicines for Parkinson's disease like benztropine, trihexyphenidyl  certain medicines for seizures like phenobarbital, primidone  general anesthetics like halothane, isoflurane, methoxyflurane, propofol  ipratropium  local anesthetics like lidocaine, pramoxine, tetracaine  medicines that relax muscles for surgery  phenothiazines like chlorpromazine, mesoridazine,  prochlorperazine, thioridazine  narcotic medicines for pain  other belladonna alkaloids This list may not describe all possible interactions. Give your health care provider a list of all the medicines, herbs, non-prescription drugs, or dietary supplements you use. Also tell them if you smoke, drink alcohol, or use illegal drugs. Some items may interact with your medicine. What should I watch for while using this medicine? Limit contact with water while swimming and bathing because the patch may fall off. If the patch falls off, throw it away and put a new one behind the other ear. You may get drowsy or dizzy. Do not drive, use machinery, or do anything that needs mental alertness until you know how this medicine affects you. Do not stand or sit up quickly, especially if you are an older patient. This reduces the risk of dizzy or fainting spells. Alcohol may interfere with the effect of this medicine. Avoid alcoholic drinks. Your mouth may get dry. Chewing sugarless gum or sucking hard candy, and drinking plenty of water may help. Contact your healthcare professional if the problem does not go away or is severe. This medicine may cause dry eyes and blurred vision. If you wear contact lenses, you may feel some discomfort. Lubricating drops may help. See your healthcare professional if the problem does not go away or is severe. If you are going to need surgery, an MRI, CT scan, or other procedure, tell your healthcare professional that you are using this medicine. You may need to remove the patch before the procedure. What side effects may I notice from receiving this medicine? Side effects that you should report to your doctor or health care professional as soon as possible:  allergic reactions like skin rash, itching or hives; swelling of the face, lips, or tongue  blurred vision  changes in vision  confusion  dizziness  eye pain  fast, irregular heartbeat  hallucinations, loss of contact  with reality  nausea, vomiting  pain or trouble passing urine  restlessness  seizures  skin irritation  stomach pain Side effects that usually do not require medical attention (report to your doctor or health care professional if they continue or are bothersome):  drowsiness  dry mouth  headache  sore throat This list may not describe all possible side effects. Call your doctor for medical advice about side effects. You may report side effects to FDA at 1-800-FDA-1088. Where should I keep my medicine? Keep out of the reach of children. Store at room temperature between 20 and 25 degrees C (68 and 77 degrees F). Keep this medicine in the foil package until ready to use. Throw away any unused medicine after the expiration date. NOTE: This sheet is a summary. It may not cover all possible information. If you have questions about this medicine, talk to your doctor, pharmacist, or health care provider.  2020 Elsevier/Gold Standard (2018-01-14 16:14:46)

## 2020-09-26 NOTE — Anesthesia Postprocedure Evaluation (Signed)
Anesthesia Post Note  Patient: Hannah Hudson  Procedure(s) Performed: SEPTOPLASTY (Bilateral Nose) INFERIOR TURBINATE REDUCTION RIGHT MIDDLE (Bilateral Nose) NASAL VALVE REPAIR WITH NASAL IMPLANT, VIVAER (Bilateral Nose)     Patient location during evaluation: PACU Anesthesia Type: General Level of consciousness: awake and alert Pain management: pain level controlled Vital Signs Assessment: post-procedure vital signs reviewed and stable Respiratory status: spontaneous breathing, nonlabored ventilation, respiratory function stable and patient connected to nasal cannula oxygen Cardiovascular status: blood pressure returned to baseline and stable Postop Assessment: no apparent nausea or vomiting Anesthetic complications: no   No complications documented.  Trecia Rogers

## 2020-09-26 NOTE — Anesthesia Preprocedure Evaluation (Signed)
Anesthesia Evaluation  Patient identified by MRN, date of birth, ID band Patient awake    Reviewed: Allergy & Precautions, H&P , NPO status , Patient's Chart, lab work & pertinent test results, reviewed documented beta blocker date and time   History of Anesthesia Complications (+) PONV and history of anesthetic complications  Airway Mallampati: II  TM Distance: >3 FB Neck ROM: full    Dental no notable dental hx.    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   Pulmonary exam normal breath sounds clear to auscultation       Cardiovascular Exercise Tolerance: Good negative cardio ROS Normal cardiovascular exam Rhythm:regular Rate:Normal     Neuro/Psych negative neurological ROS  negative psych ROS   GI/Hepatic Neg liver ROS, GERD  Controlled,  Endo/Other  negative endocrine ROS  Renal/GU negative Renal ROS  negative genitourinary   Musculoskeletal   Abdominal   Peds  Hematology negative hematology ROS (+)   Anesthesia Other Findings   Reproductive/Obstetrics negative OB ROS                             Anesthesia Physical Anesthesia Plan  ASA: II  Anesthesia Plan: General   Post-op Pain Management:    Induction:   PONV Risk Score and Plan:   Airway Management Planned:   Additional Equipment:   Intra-op Plan:   Post-operative Plan:   Informed Consent: I have reviewed the patients History and Physical, chart, labs and discussed the procedure including the risks, benefits and alternatives for the proposed anesthesia with the patient or authorized representative who has indicated his/her understanding and acceptance.     Dental Advisory Given  Plan Discussed with: CRNA  Anesthesia Plan Comments:         Anesthesia Quick Evaluation

## 2020-09-26 NOTE — Transfer of Care (Signed)
Immediate Anesthesia Transfer of Care Note  Patient: Hannah Hudson  Procedure(s) Performed: SEPTOPLASTY (Bilateral Nose) INFERIOR TURBINATE REDUCTION RIGHT MIDDLE (Bilateral Nose) NASAL VALVE REPAIR WITH NASAL IMPLANT, VIVAER (Bilateral Nose)  Patient Location: PACU  Anesthesia Type: General  Level of Consciousness: awake, alert  and patient cooperative  Airway and Oxygen Therapy: Patient Spontanous Breathing and Patient connected to supplemental oxygen  Post-op Assessment: Post-op Vital signs reviewed, Patient's Cardiovascular Status Stable, Respiratory Function Stable, Patent Airway and No signs of Nausea or vomiting  Post-op Vital Signs: Reviewed and stable  Complications: No complications documented.

## 2020-09-27 ENCOUNTER — Encounter: Payer: Self-pay | Admitting: Otolaryngology

## 2020-11-20 ENCOUNTER — Encounter: Payer: Managed Care, Other (non HMO) | Admitting: Dermatology

## 2020-12-05 ENCOUNTER — Other Ambulatory Visit: Payer: Self-pay

## 2020-12-05 ENCOUNTER — Ambulatory Visit: Payer: Managed Care, Other (non HMO) | Admitting: Dermatology

## 2020-12-05 ENCOUNTER — Encounter: Payer: Self-pay | Admitting: Dermatology

## 2020-12-05 DIAGNOSIS — Z1283 Encounter for screening for malignant neoplasm of skin: Secondary | ICD-10-CM | POA: Diagnosis not present

## 2020-12-05 DIAGNOSIS — L578 Other skin changes due to chronic exposure to nonionizing radiation: Secondary | ICD-10-CM

## 2020-12-05 DIAGNOSIS — Z85828 Personal history of other malignant neoplasm of skin: Secondary | ICD-10-CM

## 2020-12-05 DIAGNOSIS — L918 Other hypertrophic disorders of the skin: Secondary | ICD-10-CM

## 2020-12-05 DIAGNOSIS — L814 Other melanin hyperpigmentation: Secondary | ICD-10-CM

## 2020-12-05 DIAGNOSIS — Z86018 Personal history of other benign neoplasm: Secondary | ICD-10-CM | POA: Diagnosis not present

## 2020-12-05 DIAGNOSIS — L988 Other specified disorders of the skin and subcutaneous tissue: Secondary | ICD-10-CM

## 2020-12-05 DIAGNOSIS — L720 Epidermal cyst: Secondary | ICD-10-CM

## 2020-12-05 DIAGNOSIS — L821 Other seborrheic keratosis: Secondary | ICD-10-CM

## 2020-12-05 DIAGNOSIS — D1800 Hemangioma unspecified site: Secondary | ICD-10-CM

## 2020-12-05 DIAGNOSIS — D229 Melanocytic nevi, unspecified: Secondary | ICD-10-CM

## 2020-12-05 NOTE — Progress Notes (Signed)
   Follow-Up Visit   Subjective  Hannah Hudson is a 40 y.o. female who presents for the following: Annual Exam (Hx dysplastic nevus). The patient presents for Total-Body Skin Exam (TBSE) for skin cancer screening and mole check.  The following portions of the chart were reviewed this encounter and updated as appropriate:   Tobacco  Allergies  Meds  Problems  Med Hx  Surg Hx  Fam Hx     Review of Systems:  No other skin or systemic complaints except as noted in HPI or Assessment and Plan.  Objective  Well appearing patient in no apparent distress; mood and affect are within normal limits.  A full examination was performed including scalp, head, eyes, ears, nose, lips, neck, chest, axillae, abdomen, back, buttocks, bilateral upper extremities, bilateral lower extremities, hands, feet, fingers, toes, fingernails, and toenails. All findings within normal limits unless otherwise noted below.  Objective  R cheek: Smooth white papule(s).   Objective  Face: Rhytides and volume loss.   Assessment & Plan  Milia R cheek  Benign appearing, observe.  Elastosis of skin Face  Recommend some antioxidant eye cream, sunscreen, and moisturizer daily. Discussed laser treatment for the face to help even skin tone.  Skin cancer screening   Lentigines - Scattered tan macules - Discussed due to sun exposure - Benign, observe - Call for any changes  Seborrheic Keratoses - Stuck-on, waxy, tan-brown papules and plaques  - Discussed benign etiology and prognosis. - Observe - Call for any changes  Melanocytic Nevi - Tan-brown and/or pink-flesh-colored symmetric macules and papules - Benign appearing on exam today - Observation - Call clinic for new or changing moles - Recommend daily use of broad spectrum spf 30+ sunscreen to sun-exposed areas.   Hemangiomas - Red papules - Discussed benign nature - Observe - Call for any changes  Actinic Damage - Chronic, secondary to  cumulative UV/sun exposure - diffuse scaly erythematous macules with underlying dyspigmentation - Recommend daily broad spectrum sunscreen SPF 30+ to sun-exposed areas, reapply every 2 hours as needed.  - Call for new or changing lesions.  History of Basal Cell Carcinoma of the Skin - No evidence of recurrence today - Recommend regular full body skin exams - Recommend daily broad spectrum sunscreen SPF 30+ to sun-exposed areas, reapply every 2 hours as needed.  - Call if any new or changing lesions are noted between office visits  History of Dysplastic Nevi - No evidence of recurrence today - Recommend regular full body skin exams - Recommend daily broad spectrum sunscreen SPF 30+ to sun-exposed areas, reapply every 2 hours as needed.  - Call if any new or changing lesions are noted between office visits  Acrochordons (Skin Tags) - Fleshy, skin-colored pedunculated papules - Benign appearing.  - Observe. - If desired, they can be removed with an in office procedure that is not covered by insurance. - Please call the clinic if you notice any new or changing lesions.  Skin cancer screening performed today.  Return in about 1 year (around 12/05/2021) for TBSE- hx dysplastic nevus and BCC; BBL of the face with Dr. Nehemiah Massed.  Luther Redo, CMA, am acting as scribe for Sarina Ser, MD .  Documentation: I have reviewed the above documentation for accuracy and completeness, and I agree with the above.  Sarina Ser, MD

## 2020-12-17 ENCOUNTER — Encounter: Payer: Self-pay | Admitting: Dermatology

## 2021-02-27 ENCOUNTER — Ambulatory Visit: Payer: Managed Care, Other (non HMO) | Admitting: Dermatology

## 2021-06-10 ENCOUNTER — Encounter: Payer: Self-pay | Admitting: Obstetrics and Gynecology

## 2021-06-10 ENCOUNTER — Ambulatory Visit (INDEPENDENT_AMBULATORY_CARE_PROVIDER_SITE_OTHER): Payer: Managed Care, Other (non HMO) | Admitting: Obstetrics and Gynecology

## 2021-06-10 ENCOUNTER — Other Ambulatory Visit: Payer: Self-pay

## 2021-06-10 ENCOUNTER — Other Ambulatory Visit (HOSPITAL_COMMUNITY)
Admission: RE | Admit: 2021-06-10 | Discharge: 2021-06-10 | Disposition: A | Discharge status: Home / Self Care | Payer: Managed Care, Other (non HMO) | Source: Ambulatory Visit | Attending: Obstetrics and Gynecology | Admitting: Obstetrics and Gynecology

## 2021-06-10 VITALS — BP 117/77 | HR 76 | Ht 66.0 in | Wt 241.0 lb

## 2021-06-10 DIAGNOSIS — Z1231 Encounter for screening mammogram for malignant neoplasm of breast: Secondary | ICD-10-CM | POA: Diagnosis not present

## 2021-06-10 DIAGNOSIS — Z124 Encounter for screening for malignant neoplasm of cervix: Secondary | ICD-10-CM | POA: Insufficient documentation

## 2021-06-10 DIAGNOSIS — Z01419 Encounter for gynecological examination (general) (routine) without abnormal findings: Secondary | ICD-10-CM | POA: Diagnosis not present

## 2021-06-10 NOTE — Progress Notes (Signed)
HPI:      [name] is a 40 y.o. [phone] who LMP was Patient's last menstrual period was 05/23/2021.  Subjective:   She presents today for her annual examination.  Facial congestion was relieved by nasal surgery.  Patient is doing much better. She states that her menses are still sometimes heavy but not always.  She does not desire any management for this. Birth control is vasectomy for her husband. She does state that she got molluscum contagiosum and she has seen a dermatologist for this he has given her some cream to put on them and they are currently very irritated and she is not sure if they are going away.    Hx: The following portions of the patient's history were reviewed and updated as appropriate:             She  has a past medical history of Basal cell carcinoma, Complication of anesthesia, Elevated glucose tolerance test, Family history of adverse reaction to anesthesia, Fetal size inconsistent with dates, GERD (gastroesophageal reflux disease), History of dysplastic nevus (11/16/2019), preeclampsia, prior pregnancy, currently pregnant (2008), Morbid obesity with BMI of 40.0-44.9, adult ([facility]), Prior fetal macrosomia, antepartum, PROM (premature rupture of membranes) (03/21/2015), Sinus tachycardia, Sleep apnea, Urinary tract infection, recurrent, and Vertigo. She does not have any pertinent problems on file. She  has a past surgical history that includes Appendectomy (2010); Laparoscopic bilateral salpingectomy (2010); Cholecystectomy (2007); Septoplasty (Bilateral, 09/26/2020); and Turbinate reduction (Bilateral, 09/26/2020). Her family history includes Bone cancer in her sister; Diabetes in her father and mother; Hypertension in her father and mother; Rheum arthritis in her mother. She  reports that she has never smoked. She has never used smokeless tobacco. She reports that she does not drink alcohol and does not use drugs. She has a current medication list which includes  the following prescription(s): cephalexin, cyanocobalamin, epinephrine, mirtazapine, prilosec otc, prednisone, propranolol, pseudoephedrine hcl, tramadol, and triamcinolone. She is allergic to topamax [topiramate] and vicodin [hydrocodone-acetaminophen].       Review of Systems:  Review of Systems  Constitutional: Denied constitutional symptoms, night sweats, recent illness, fatigue, fever, insomnia and weight loss.  Eyes: Denied eye symptoms, eye pain, photophobia, vision change and visual disturbance.  Ears/Nose/Throat/Neck: Denied ear, nose, throat or neck symptoms, hearing loss, nasal discharge, sinus congestion and sore throat.  Cardiovascular: Denied cardiovascular symptoms, arrhythmia, chest pain/pressure, edema, exercise intolerance, orthopnea and palpitations.  Respiratory: Denied pulmonary symptoms, asthma, pleuritic pain, productive sputum, cough, dyspnea and wheezing.  Gastrointestinal: Denied, gastro-esophageal reflux, melena, nausea and vomiting.  Genitourinary: Denied genitourinary symptoms including symptomatic vaginal discharge, pelvic relaxation issues, and urinary complaints.  Musculoskeletal: Denied musculoskeletal symptoms, stiffness, swelling, muscle weakness and myalgia.  Dermatologic: Denied dermatology symptoms, rash and scar.  Neurologic: Denied neurology symptoms, dizziness, headache, neck pain and syncope.  Psychiatric: Denied psychiatric symptoms, anxiety and depression.  Endocrine: Denied endocrine symptoms including hot flashes and night sweats.   Meds:   Current Outpatient Medications on File Prior to Visit  Medication Sig Dispense Refill   cephALEXin (KEFLEX) 500 MG capsule Take 1 capsule (500 mg total) by mouth 2 (two) times daily. (Patient not taking: Reported on 12/05/2020) 14 capsule 0   Cyanocobalamin 1000 MCG SUBL Place under the tongue. (Patient not taking: Reported on 12/05/2020)     EPINEPHrine 0.3 mg/0.3 mL IJ SOAJ injection USE AS DIRECTED DURING  ANAPHYLACTIC REACTION (Patient not taking: No sig reported)     mirtazapine (REMERON) 7.5 MG tablet  omeprazole (PRILOSEC OTC) 20 MG tablet Take by mouth. (Patient not taking: Reported on 09/19/2020)     predniSONE (DELTASONE) 10 MG tablet Start with 3 pills tomorrow. Taper over the next 6 days.  3,3,2,2,1,1. (Patient not taking: Reported on 12/05/2020) 12 tablet 0   propranolol (INDERAL) 20 MG tablet Take by mouth. (Patient not taking: Reported on 09/19/2020)     Pseudoephedrine HCl (SUDAFED PO) Take by mouth as needed. (Patient not taking: Reported on 12/05/2020)     traMADol (ULTRAM) 50 MG tablet 1-2 tabs every 6 hours as needed for severe pain. (Patient not taking: Reported on 12/05/2020) 20 tablet 0   triamcinolone (NASACORT) 55 MCG/ACT AERO nasal inhaler Place 2 sprays into the nose daily as needed. (Patient not taking: Reported on 12/05/2020)     No current facility-administered medications on file prior to visit.       Objective:     Vitals:   06/10/21 0858  BP: 117/77  Pulse: 76    Filed Weights   06/10/21 0858  Weight: 241 lb (109.3 kg)              Physical examination General NAD, Conversant  HEENT Atraumatic; Op clear with mmm.  Normo-cephalic. Pupils reactive. Anicteric sclerae  Thyroid/Neck Smooth without nodularity or enlargement. Normal ROM.  Neck Supple.  Skin Lower abdominal muscular contagiosum noted also some noted on right thigh  Breasts: No masses or discharge.  Symmetric.  No axillary adenopathy.  Lungs: Clear to auscultation.No rales or wheezes. Normal Respiratory effort, no retractions.  Heart: NSR.  No murmurs or rubs appreciated. No periferal edema  Abdomen: Soft.  Non-tender.  No masses.  No HSM. No hernia rash on lower abdomen possibly secondary to cutaneous monilia.  Extremities: Moves all appropriately.  Normal ROM for age. No lymphadenopathy.  Neuro: Oriented to PPT.  Normal mood. Normal affect.     Pelvic:   Vulva: Normal appearance.  No  lesions.  Vagina: No lesions or abnormalities noted.  Support: Normal pelvic support.  Urethra No masses tenderness or scarring.  Meatus Normal size without lesions or prolapse.  Cervix: Normal appearance.  No lesions.  Anus: Normal exam.  No lesions.  Perineum: Normal exam.  No lesions.        Bimanual   Uterus: Normal size.  Non-tender.  Mobile.  AV.  Adnexae: No masses.  Non-tender to palpation.  Cul-de-sac: Negative for abnormality.     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 Well woman exam with routine gynecological exam        Plan:            1.  Basic Screening Recommendations The basic screening recommendations for asymptomatic women were discussed with the patient during her visit.  The age-appropriate recommendations were discussed with her and the rational for the tests reviewed.  When I am informed by the patient that another primary care physician has previously obtained the age-appropriate tests and they are up-to-date, only outstanding tests are ordered and referrals given as necessary.  Abnormal results of tests will be discussed with her when all of her results are completed.  Routine preventative health maintenance measures emphasized: Exercise/Diet/Weight control, Tobacco  Warnings, Alcohol/Substance use risks and Stress Management Pap performed-mammogram ordered 2.  Advised continued use of cream for molluscum 3.  Use of antifungal cream in the area of rash along her lower abdomen  Orders No orders of the defined types were placed in this encounter.   No orders of the defined types were placed in this encounter.         F/U  Return in about 1 year (around 06/10/2022)  for Annual Physical.  Finis Bud, M.D. 06/10/2021 9:54 AM

## 2021-06-11 LAB — CYTOLOGY - PAP
Comment: NEGATIVE
Diagnosis: NEGATIVE
High risk HPV: NEGATIVE

## 2021-06-12 ENCOUNTER — Encounter: Payer: Self-pay | Admitting: Dermatology

## 2021-06-12 ENCOUNTER — Ambulatory Visit: Payer: Managed Care, Other (non HMO) | Admitting: Dermatology

## 2021-06-12 ENCOUNTER — Other Ambulatory Visit: Payer: Self-pay

## 2021-06-12 DIAGNOSIS — B081 Molluscum contagiosum: Secondary | ICD-10-CM

## 2021-06-12 DIAGNOSIS — L2489 Irritant contact dermatitis due to other agents: Secondary | ICD-10-CM | POA: Diagnosis not present

## 2021-06-12 MED ORDER — MOMETASONE FUROATE 0.1 % EX CREA: 1.0000 "application " | TOPICAL_CREAM | Freq: Every day | CUTANEOUS | 0 refills | Status: DC | PRN

## 2021-06-12 NOTE — Progress Notes (Signed)
   Follow-Up Visit   Subjective  Hannah Hudson is a 40 y.o. female who presents for the following: Rash (Abdomen and right thigh - looks like molluscum and was given imiquimod by [facility] and seems to irritating skin).  The following portions of the chart were reviewed this encounter and updated as appropriate:   Tobacco  Allergies  Meds  Problems  Med Hx  Surg Hx  Fam Hx     Review of Systems:  No other skin or systemic complaints except as noted in HPI or Assessment and Plan.  Objective  Well appearing patient in no apparent distress; mood and affect are within normal limits.  A focused examination was performed including lower abdomen. Relevant physical exam findings are noted in the Assessment and Plan.  Right lower abdomen, right thigh - Total 31 (31) Smooth, pink/flesh dome-shaped papules with central umbilication - Discussed viral etiology and contagion.   Right Abdomen (side) - Lower Erythema   Assessment & Plan  Molluscum contagiosum Right lower abdomen, right thigh - Total 31  Dicussed condition and treatment options.  Discontinue Imiquimod cream For now.  May restart imiquimod if lesions formed or persist after liquid nitrogen destruction healing.  Destruction of lesion - Right lower abdomen, right thigh - Total 31 Complexity: simple   Destruction method: cryotherapy   Informed consent: discussed and consent obtained   Timeout:  patient name, date of birth, surgical site, and procedure verified Lesion destroyed using liquid nitrogen: Yes   Region frozen until ice ball extended beyond lesion: Yes   Outcome: patient tolerated procedure well with no complications   Post-procedure details: wound care instructions given    Molluscum are small wart-like bumps caused by a viral infection in the skin and can easily spread.  More commonly seen in children who have eczema, because of dry inflamed skin and frequent scratching.  Use your prescription topical eczema  medication as directed if prescribed.  Recommend routine use of mild soap and moisturizing cream to prevent spread.  Do not share towels.  Multiple treatments may be required to clear molluscum.  Inflammatory dermatitis due to imiquimod Right Abdomen (side) - Lower Advised this is normal side effect to imiquimod and shows its working.  Nevertheless may discontinue it since we have treated with liquid nitrogen.  May restart imiquimod if persistent or new lesions. Start Mometsone cream qd to calm down imiquimod dermatitis  mometasone (ELOCON) 0.1 % cream - Right Abdomen (side) - Lower Apply 1 application topically daily as needed (Rash).  Return for Follow up as scheduled.  I, Ashok Cordia, CMA, am acting as scribe for Sarina Ser, MD . Documentation: I have reviewed the above documentation for accuracy and completeness, and I agree with the above.  Sarina Ser, MD

## 2021-06-12 NOTE — Patient Instructions (Signed)

## 2021-07-17 ENCOUNTER — Ambulatory Visit: Payer: Managed Care, Other (non HMO) | Admitting: Dermatology

## 2021-09-23 IMAGING — MR MR HEAD WO/W CM
14 series · 48 of 48 positions shown · IV contrast (10ml Gadavist)
Comparison: None.

CLINICAL DATA: 38-year-old female with recurrent headaches and
intermittent numbness in the left foot for several months.

EXAM:
MRI HEAD WITHOUT AND WITH CONTRAST
TECHNIQUE: Multiplanar, multiecho pulse sequences of the brain and surrounding
structures were obtained without and with intravenous contrast.
CONTRAST:  10mL GADAVIST GADOBUTROL 1 MMOL/ML IV SOLN

[Series 5: ax dwi_tracew · axial · 3.0mm · 0.60mm/px · z∈[-124,+30]mm · 3 of 48 slices shown]
[im 1/48]
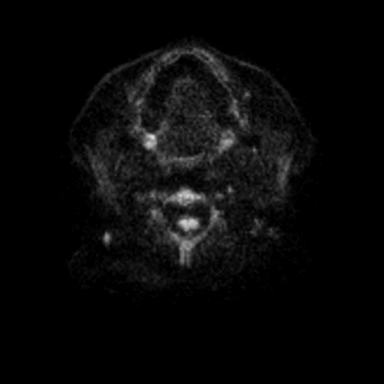
[im 24/48]
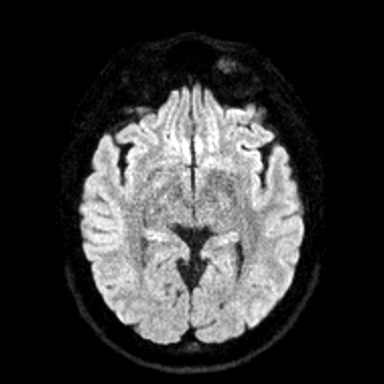
[im 48/48]
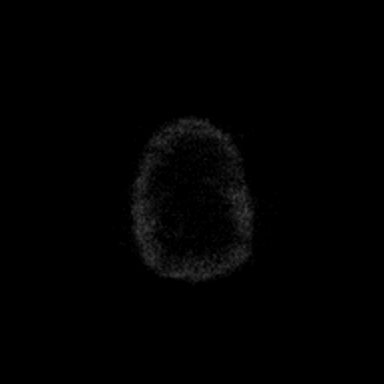

[Series 6: ax dwi_adc · axial · 3.0mm · 0.60mm/px · z∈[-124,+30]mm · 3 of 48 slices shown]
[im 1/48]
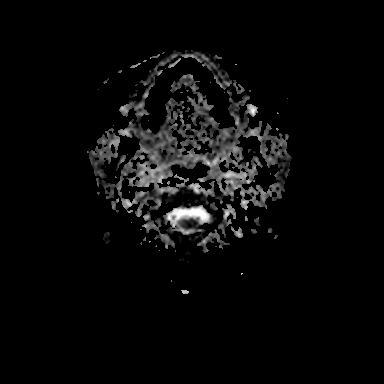
[im 24/48]
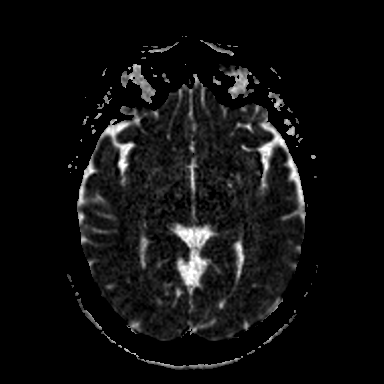
[im 48/48]
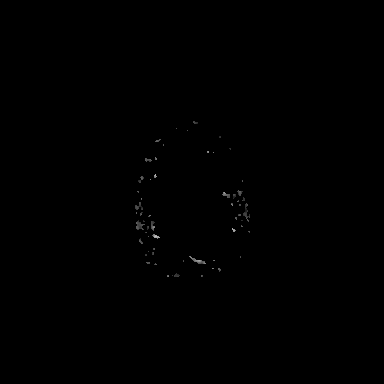

[Series 7: cor dwi_tracew · coronal · 5.0mm · 0.60mm/px · 2 of 38 slices shown]
[im 1/38]
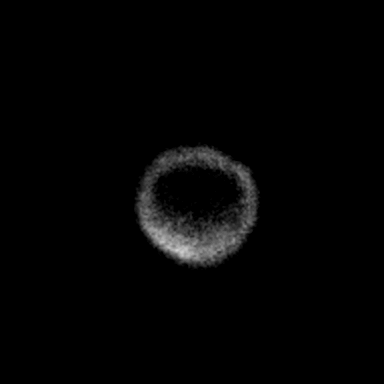
[im 38/38]
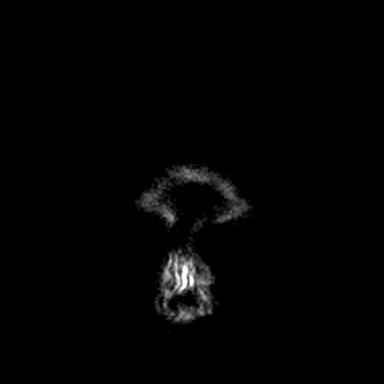

[Series 8: cor dwi_adc · coronal · 5.0mm · 0.60mm/px · 2 of 38 slices shown]
[im 1/38]
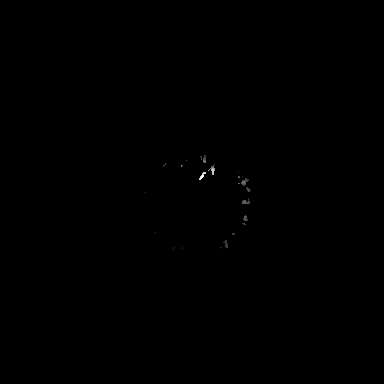
[im 38/38]
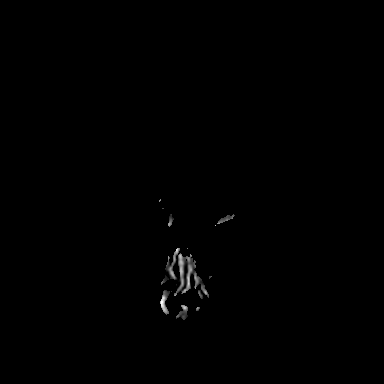

[Series 9: T1 · sagittal · 5.0mm · 0.62mm/px · 1 of 23 slices shown (1 of 2)]
[im 1/23]
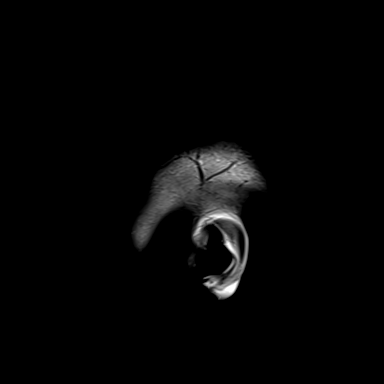

[Series 10: T2 · axial · 5.0mm · 0.53mm/px · 1 of 25 slices shown]
[im 1/25]
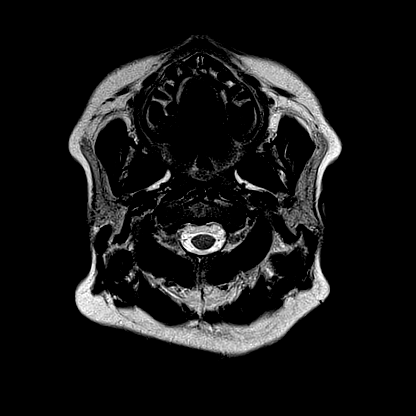

[Series 11: mag_images · axial · 3.0mm · 0.90mm/px · z∈[-135,+41]mm · 3 of 60 slices shown]
[im 1/60]
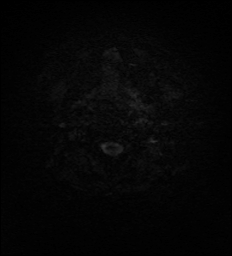
[im 30/60]
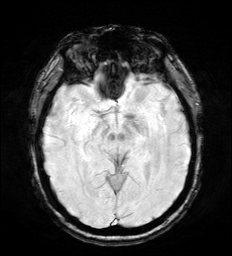
[im 60/60]
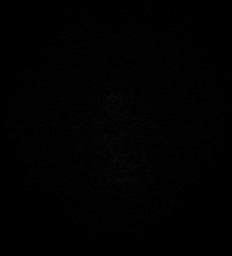

[Series 12: pha_images · axial · 3.0mm · 0.90mm/px · z∈[-135,+38]mm · 3 of 59 slices shown]
[im 1/59]
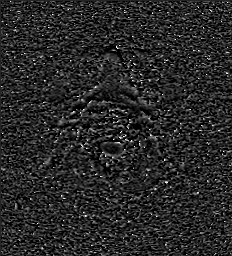
[im 30/59]
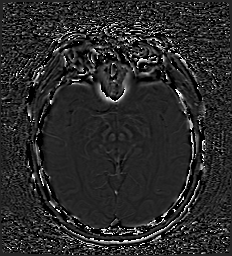
[im 59/59]
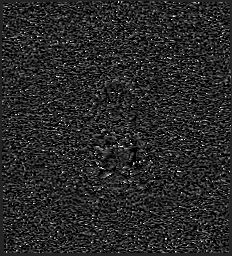

[Series 13: swi_images · axial · 3.0mm · 0.90mm/px · z∈[-135,+41]mm · 3 of 60 slices shown]
[im 1/60]
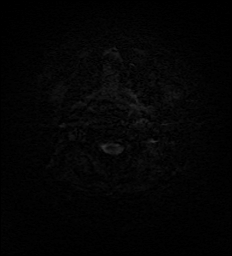
[im 30/60]
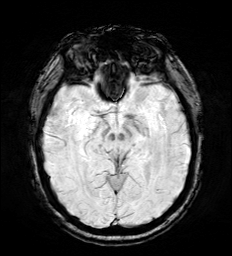
[im 60/60]
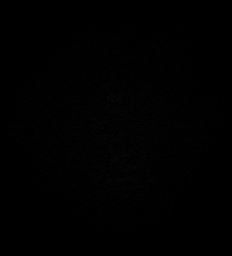

[Series 15: FLAIR · axial · 3.0mm · 0.53mm/px · z∈[-128,+33]mm · 3 of 55 slices shown]
[im 1/55]
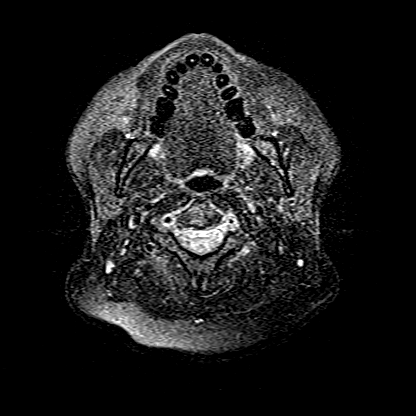
[im 28/55]
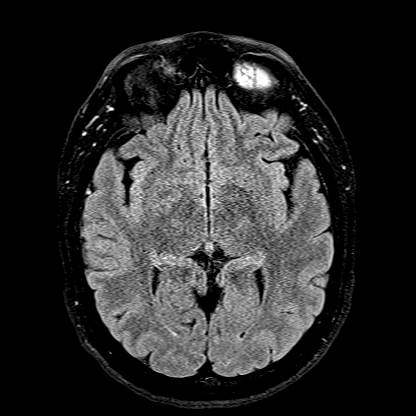
[im 55/55]
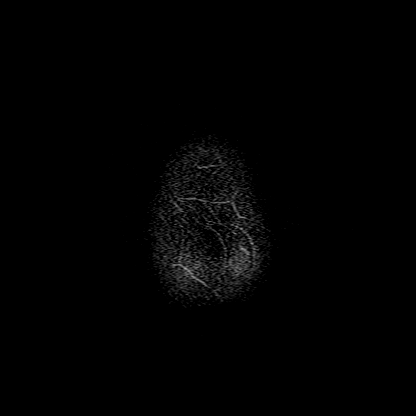

[Series 16: T1 · axial · 1.0mm · 0.98mm/px · z∈[-133,+41]mm · 10 of 176 slices shown (2 of 2)]
[im 1/176]
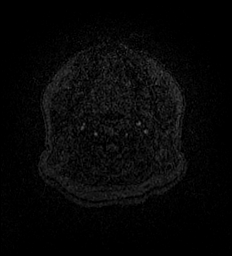
[im 20/176]
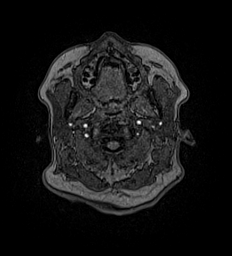
[im 39/176]
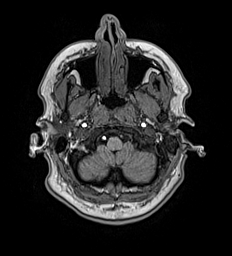
[im 59/176]
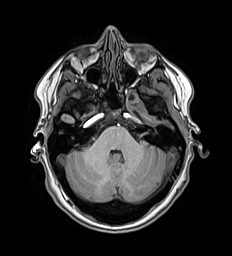
[im 78/176]
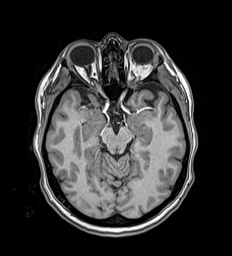
[im 98/176]
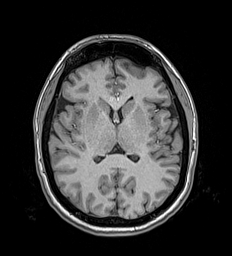
[im 117/176]
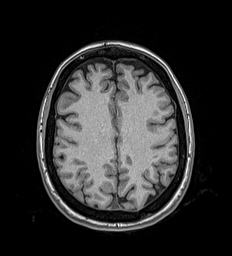
[im 137/176]
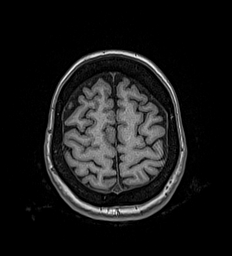
[im 156/176]
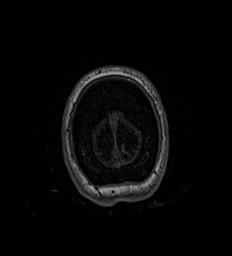
[im 176/176]
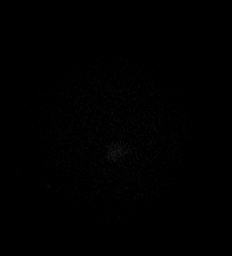

[Series 17: T2 post-contrast · coronal · 5.0mm · 0.57mm/px · 2 of 29 slices shown]
[im 1/29]
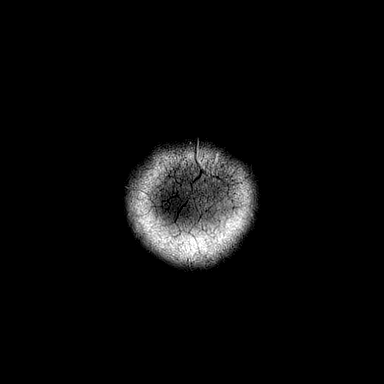
[im 29/29]
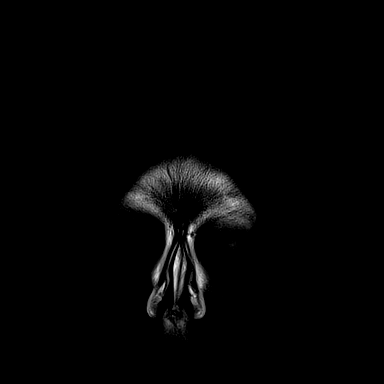

[Series 18: T1 post-contrast · axial · 1.0mm · 0.98mm/px · z∈[-133,+41]mm · 10 of 176 slices shown (1 of 2)]
[im 1/176]
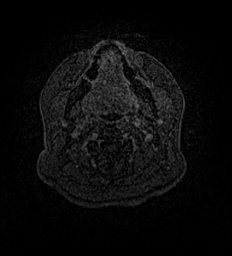
[im 20/176]
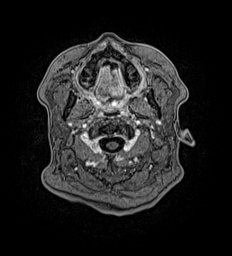
[im 39/176]
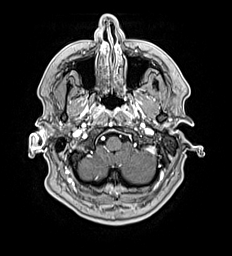
[im 59/176]
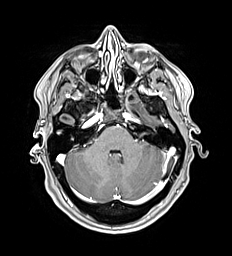
[im 78/176]
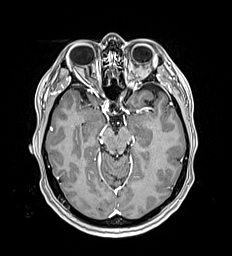
[im 98/176]
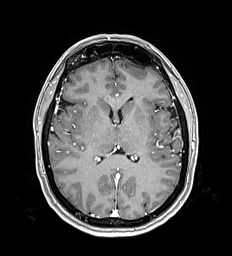
[im 117/176]
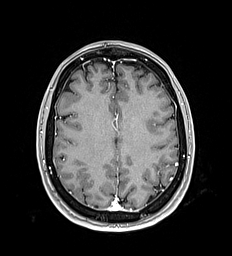
[im 137/176]
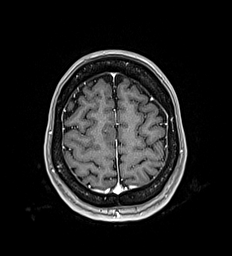
[im 156/176]
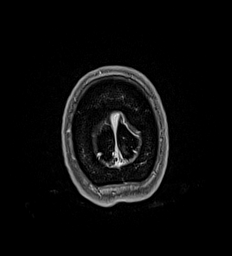
[im 176/176]
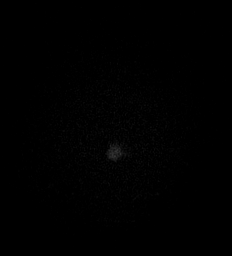

[Series 19: T1 post-contrast · coronal · 5.0mm · 0.57mm/px · 2 of 29 slices shown (2 of 2)]
[im 1/29]
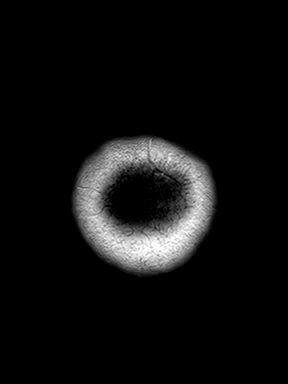
[im 29/29]
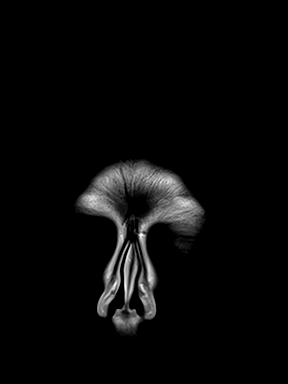

[48 of 48 positions shown; findings below may reference images not displayed]

FINDINGS: Brain: Partially empty sella.

Cerebral volume is within normal limits. No restricted diffusion to
suggest acute infarction. No midline shift, mass effect, evidence of
mass lesion, ventriculomegaly, extra-axial collection or acute
intracranial hemorrhage. Cervicomedullary junction within normal
limits.

Gray and white matter signal is within normal limits throughout the
brain. No encephalomalacia or chronic cerebral blood products.

No abnormal enhancement identified.  No dural thickening.

Vascular: Major intracranial vascular flow voids are preserved. The
right vertebral artery appears dominant. The major dural venous
sinuses are enhancing and appear to be patent.

Skull and upper cervical spine: Normal visible cervical spine.
Hyperostosis of the calvarium, normal variant. Visualized bone
marrow signal is within normal limits.

Sinuses/Orbits: Negative orbits.  Paranasal sinuses are clear.

Other: Mastoids are well pneumatized. Visible internal auditory
structures appear normal. Scalp and face soft tissues appear
negative.
IMPRESSION: 1. Partially empty sella, often a normal anatomic variant but can be
associated with idiopathic intracranial hypertension (pseudotumor
cerebri).

2. Otherwise normal MRI appearance of the brain.

## 2021-09-30 ENCOUNTER — Ambulatory Visit
Admission: RE | Admit: 2021-09-30 | Discharge: 2021-09-30 | Disposition: A | Discharge status: Home / Self Care | Payer: Managed Care, Other (non HMO) | Source: Ambulatory Visit | Attending: Obstetrics and Gynecology | Admitting: Obstetrics and Gynecology

## 2021-09-30 ENCOUNTER — Other Ambulatory Visit: Payer: Self-pay

## 2021-09-30 DIAGNOSIS — Z1231 Encounter for screening mammogram for malignant neoplasm of breast: Secondary | ICD-10-CM

## 2021-12-08 ENCOUNTER — Other Ambulatory Visit: Payer: Self-pay

## 2021-12-08 ENCOUNTER — Ambulatory Visit: Payer: Managed Care, Other (non HMO) | Admitting: Dermatology

## 2021-12-08 DIAGNOSIS — Z1283 Encounter for screening for malignant neoplasm of skin: Secondary | ICD-10-CM

## 2021-12-08 DIAGNOSIS — L578 Other skin changes due to chronic exposure to nonionizing radiation: Secondary | ICD-10-CM

## 2021-12-08 DIAGNOSIS — L814 Other melanin hyperpigmentation: Secondary | ICD-10-CM

## 2021-12-08 DIAGNOSIS — Z86018 Personal history of other benign neoplasm: Secondary | ICD-10-CM

## 2021-12-08 DIAGNOSIS — D1800 Hemangioma unspecified site: Secondary | ICD-10-CM

## 2021-12-08 DIAGNOSIS — Z85828 Personal history of other malignant neoplasm of skin: Secondary | ICD-10-CM

## 2021-12-08 DIAGNOSIS — L700 Acne vulgaris: Secondary | ICD-10-CM | POA: Diagnosis not present

## 2021-12-08 DIAGNOSIS — D229 Melanocytic nevi, unspecified: Secondary | ICD-10-CM

## 2021-12-08 DIAGNOSIS — L821 Other seborrheic keratosis: Secondary | ICD-10-CM

## 2021-12-08 NOTE — Progress Notes (Signed)
° °  Follow-Up Visit   Subjective  Hannah Hudson is a 41 y.o. female who presents for the following: Annual Exam (Mole check ). Yearly mole check, hx of BCC, hx of Dysplastic nevus. The patient presents for Total-Body Skin Exam (TBSE) for skin cancer screening and mole check.  The patient has spots, moles and lesions to be evaluated, some may be new or changing and the patient has concerns that these could be cancer.   The following portions of the chart were reviewed this encounter and updated as appropriate:   Tobacco   Allergies   Meds   Problems   Med Hx   Surg Hx   Fam Hx      Review of Systems:  No other skin or systemic complaints except as noted in HPI or Assessment and Plan.  Objective  Well appearing patient in no apparent distress; mood and affect are within normal limits.  A full examination was performed including scalp, head, eyes, ears, nose, lips, neck, chest, axillae, abdomen, back, buttocks, bilateral upper extremities, bilateral lower extremities, hands, feet, fingers, toes, fingernails, and toenails. All findings within normal limits unless otherwise noted below.  neck Mild acne    Assessment & Plan  Acne vulgaris neck 1 papule  No treatment needed   Skin cancer screening  Lentigines - Scattered tan macules - Due to sun exposure - Benign-appearing, observe - Recommend daily broad spectrum sunscreen SPF 30+ to sun-exposed areas, reapply every 2 hours as needed. - Call for any changes  Seborrheic Keratoses - Stuck-on, waxy, tan-brown papules and/or plaques  - Benign-appearing - Discussed benign etiology and prognosis. - Observe - Call for any changes  Melanocytic Nevi - Tan-brown and/or pink-flesh-colored symmetric macules and papules - Benign appearing on exam today - Observation - Call clinic for new or changing moles - Recommend daily use of broad spectrum spf 30+ sunscreen to sun-exposed areas.   Hemangiomas - Red papules - Discussed benign  nature - Observe - Call for any changes  Actinic Damage - Chronic condition, secondary to cumulative UV/sun exposure - diffuse scaly erythematous macules with underlying dyspigmentation - Recommend daily broad spectrum sunscreen SPF 30+ to sun-exposed areas, reapply every 2 hours as needed.  - Staying in the shade or wearing long sleeves, sun glasses (UVA+UVB protection) and wide brim hats (4-inch brim around the entire circumference of the hat) are also recommended for sun protection.  - Call for new or changing lesions.  History of Dysplastic Nevi Left ant distal medial thigh/mod 11/16/2019 - No evidence of recurrence today - Recommend regular full body skin exams - Recommend daily broad spectrum sunscreen SPF 30+ to sun-exposed areas, reapply every 2 hours as needed.  - Call if any new or changing lesions are noted between office visits   History of Basal Cell Carcinoma of the Skin Right neck  - No evidence of recurrence today - Recommend regular full body skin exams - Recommend daily broad spectrum sunscreen SPF 30+ to sun-exposed areas, reapply every 2 hours as needed.  - Call if any new or changing lesions are noted between office visits   Skin cancer screening performed today.   Return in about 1 year (around 12/08/2022) for TBSE, hx of BCC, hx of Dysplastic nevus .  IMarye Round, CMA, am acting as scribe for Sarina Ser, MD .  Documentation: I have reviewed the above documentation for accuracy and completeness, and I agree with the above.  Sarina Ser, MD

## 2021-12-08 NOTE — Patient Instructions (Signed)

## 2021-12-09 ENCOUNTER — Encounter: Payer: Self-pay | Admitting: Dermatology

## 2022-02-11 ENCOUNTER — Encounter: Payer: Managed Care, Other (non HMO) | Admitting: Obstetrics and Gynecology

## 2022-02-19 ENCOUNTER — Encounter: Payer: Self-pay | Admitting: Obstetrics and Gynecology

## 2022-02-19 ENCOUNTER — Ambulatory Visit: Payer: Managed Care, Other (non HMO) | Admitting: Obstetrics and Gynecology

## 2022-02-19 VITALS — BP 122/71 | HR 89 | Ht 66.0 in | Wt 249.7 lb

## 2022-02-19 DIAGNOSIS — R102 Pelvic and perineal pain: Secondary | ICD-10-CM

## 2022-02-19 NOTE — Progress Notes (Signed)
HPI: ?     [name] is a 41 y.o. [id number] who LMP was Patient's last menstrual period was 02/08/2022. ? ?Subjective:  ? ?She presents today with complaint of 73-monthhistory of pelvic pressure symptoms associated with her menstrual cycle.  She reports some midcycle discomfort for 1 day and pelvic pressure symptoms with her menses.  Remainder of the days she usually does not have a problem.  She does use  NSAIDs without much pain relief.  She continues to have normal regular cycles monthly with her menses lasting approximately 4 days. ?Her husband has a vasectomy for birth control. ?She reports a remote history of ovarian cyst for which she underwent laparoscopy.  Since that time she has had 2 children and no problems related to this. ?She reports a family history of endometriosis. ? ?  Hx: ?The following portions of the patient's history were reviewed and updated as appropriate: ?            She  has a past medical history of Basal cell carcinoma, Complication of anesthesia, Elevated glucose tolerance test, Family history of adverse reaction to anesthesia, Fetal size inconsistent with dates, GERD (gastroesophageal reflux disease), History of dysplastic nevus (11/16/2019), preeclampsia, prior pregnancy, currently pregnant (2008), Morbid obesity with BMI of 40.0-44.9, adult ([place], Prior fetal macrosomia, antepartum, PROM (premature rupture of membranes) (03/21/2015), Sinus tachycardia, Sleep apnea, Urinary tract infection, recurrent, and Vertigo. ?She does not have any pertinent problems on file. ?She  has a past surgical history that includes Appendectomy (2010); Laparoscopic bilateral salpingectomy (2010); Cholecystectomy (2007); Septoplasty (Bilateral, 09/26/2020); and Turbinate reduction (Bilateral, 09/26/2020). ?Her family history includes Bone cancer in her sister; Diabetes in her father and mother; Hypertension in her father and mother; Rheum arthritis in her mother. ?She  reports that she has never  smoked. She has never used smokeless tobacco. She reports that she does not drink alcohol and does not use drugs. ?She has a current medication list which includes the following prescription(s): mirtazapine, NON FORMULARY, and triamcinolone. ?She is allergic to topamax [topiramate] and vicodin [hydrocodone-acetaminophen]. ?      ?Review of Systems:  ?Review of Systems ? ?Constitutional: Denied constitutional symptoms, night sweats, recent illness, fatigue, fever, insomnia and weight loss.  ?Eyes: Denied eye symptoms, eye pain, photophobia, vision change and visual disturbance.  ?Ears/Nose/Throat/Neck: Denied ear, nose, throat or neck symptoms, hearing loss, nasal discharge, sinus congestion and sore throat.  ?Cardiovascular: Denied cardiovascular symptoms, arrhythmia, chest pain/pressure, edema, exercise intolerance, orthopnea and palpitations.  ?Respiratory: Denied pulmonary symptoms, asthma, pleuritic pain, productive sputum, cough, dyspnea and wheezing.  ?Gastrointestinal: Denied, gastro-esophageal reflux, melena, nausea and vomiting.  ?Genitourinary: Denied genitourinary symptoms including symptomatic vaginal discharge, pelvic relaxation issues, and urinary complaints.  ?Musculoskeletal: Denied musculoskeletal symptoms, stiffness, swelling, muscle weakness and myalgia.  ?Dermatologic: Denied dermatology symptoms, rash and scar.  ?Neurologic: Denied neurology symptoms, dizziness, headache, neck pain and syncope.  ?Psychiatric: Denied psychiatric symptoms, anxiety and depression.  ?Endocrine: Denied endocrine symptoms including hot flashes and night sweats.  ? ?Meds: ?  ?Current Outpatient Medications on File Prior to Visit  ?Medication Sig Dispense Refill  ? mirtazapine (REMERON) 7.5 MG tablet     ? NON FORMULARY B 12 injection    ? triamcinolone (NASACORT) 55 MCG/ACT AERO nasal inhaler Place 2 sprays into the nose daily as needed.    ? ?No current facility-administered medications on file prior to visit.   ? ? ? ? ?Objective:  ?  ? ?Vitals:  ? 02/19/22  0857  ?BP: 122/71  ?Pulse: 89  ? ?Filed Weights  ? 02/19/22 0857  ?Weight: 249 lb 11.2 oz (113.3 kg)  ? ?  ?          ?        ? ?Assessment:  ?  ?G4P3010 ?Patient Active Problem List  ? Diagnosis Date Noted  ? Leg pain 07/26/2020  ? Tachycardia 05/10/2020  ? Seasonal allergic rhinitis due to pollen 02/22/2020  ? 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 Hyperlipidemia, mild 11/18/2017  ? Sore throat, chronic 11/17/2016  ? Adult BMI 31.0-31.9 kg/sq m 04/19/2015  ? ?  ?1. Pelvic pressure in female   ? ? Seems to be cycle related so likely GYN. ? ? ? ?Plan:  ?  ?       ? 1.  Pelvic ultrasound ? 2.  Future exam for possible early pelvic relaxation ? 3.  Trial of OCPs ?Orders ?Orders Placed This Encounter  ?Procedures  ? US PELVIS (TRANSABDOMINAL ONLY)  ? US PELVIS TRANSVAGINAL NON-OB (TV ONLY)  ? ? No orders of the defined types were placed in this encounter. ?  ?  F/U ? Return for We will contact her with any abnormal test results. ?I spent 22 minutes involved in the care of this patient preparing to see the patient by obtaining and reviewing her medical history (including labs, imaging tests and prior procedures), documenting clinical information in the electronic health record (EHR), counseling and coordinating care plans, writing and sending prescriptions, ordering tests or procedures and in direct communicating with the patient and medical staff discussing pertinent items from her history and physical exam. ? ?Finis Bud, M.D. ?02/19/2022 ?9:17 AM ? ? ? ? ?

## 2022-02-19 NOTE — Progress Notes (Signed)
Patient presents today for possible ovarian cyst. She reports pelvic pain and pressure along with bloating starting 3-4 months ago, typically on right side. She states the pain typically comes on with her menstrual cycle and then midway to the next. Patient is not on any birth control at this time. Patient states no other questions or concerns.  ?

## 2022-02-24 ENCOUNTER — Other Ambulatory Visit: Payer: Managed Care, Other (non HMO)

## 2022-03-06 ENCOUNTER — Ambulatory Visit (INDEPENDENT_AMBULATORY_CARE_PROVIDER_SITE_OTHER): Payer: Managed Care, Other (non HMO)

## 2022-03-06 ENCOUNTER — Other Ambulatory Visit: Payer: Self-pay | Admitting: Obstetrics and Gynecology

## 2022-03-06 DIAGNOSIS — R102 Pelvic and perineal pain: Secondary | ICD-10-CM

## 2022-03-09 ENCOUNTER — Other Ambulatory Visit: Payer: Managed Care, Other (non HMO)

## 2022-03-10 NOTE — Progress Notes (Signed)
Hannah Hudson: ?Lets find out how she is doing on her OCPs.  Her ultrasound shows possible adenomyosis.  There is nothing further to do if her OCPs are working well for her.  If she continues to have significant cramping and bleeding with her cycles she will need a video visit or an office visit to further discuss this.

## 2022-03-16 ENCOUNTER — Encounter: Payer: Self-pay | Admitting: Obstetrics and Gynecology

## 2022-03-19 NOTE — Telephone Encounter (Signed)
Spoke with patient regarding Dr. Rebekah [place] recommendation. Patient states she is following up in July with PCP regarding thyroid levels. She states she does have more questions regarding next steps and is going to schedule a virtual visit. Patient stated no immediate questions at this time.  ?

## 2022-03-24 ENCOUNTER — Encounter: Payer: Self-pay | Admitting: Obstetrics and Gynecology

## 2022-03-24 ENCOUNTER — Telehealth (INDEPENDENT_AMBULATORY_CARE_PROVIDER_SITE_OTHER): Payer: Managed Care, Other (non HMO) | Admitting: Obstetrics and Gynecology

## 2022-03-24 DIAGNOSIS — N8003 Adenomyosis of the uterus: Secondary | ICD-10-CM | POA: Diagnosis not present

## 2022-03-24 NOTE — Progress Notes (Signed)
Virtual Visit via Video Note ? ?I connected with [place] on 03/24/22 at  7:45 AM EDT by video and verified that I was speaking with the correct person using two identifiers. ?   Hannah Hudson is a 41 y.o. [id number] who LMP was No LMP recorded. ?I discussed the limitations, risks, security and privacy concerns of performing an evaluation and management service by video and the availability of in person appointments. I also discussed with the patient that there may be a patient responsible charge related to this service. The patient expressed understanding and agreed to proceed. ? ?Location of patient:  Home ? ?Patient gave explicit verbal consent for video visit:  YES ? ?Location of provider:  [facility] office ? ?Persons other than physician and patient involved in provider conference:  None ? ? ?Subjective:  ? ?History of Present Illness:    ?Patient has been having bloating and crampy pelvic symptoms.  Since her ultrasound reveals the possibility of adenomyosis she has read about adenomyosis and believes that many of the symptoms mentioned on the Internet her symptoms that she has.  She is considering birth control methods to provide cycle control and to help with the adenomyosis.   ?She also states that she occasionally has pain in her legs and a sharp stabbing pain deep in her pelvis. ? ?Hx: ?The following portions of the patient's history were reviewed and updated as appropriate: ?            She  has a past medical history of Basal cell carcinoma, Complication of anesthesia, Elevated glucose tolerance test, Family history of adverse reaction to anesthesia, Fetal size inconsistent with dates, GERD (gastroesophageal reflux disease), History of dysplastic nevus (11/16/2019), preeclampsia, prior pregnancy, currently pregnant (2008), Morbid obesity with BMI of 40.0-44.9, adult ([place]), Prior fetal macrosomia, antepartum, PROM (premature rupture of membranes) (03/21/2015), Sinus tachycardia, Sleep apnea, Urinary  tract infection, recurrent, and Vertigo. ?She does not have any pertinent problems on file. ?She  has a past surgical history that includes Appendectomy (2010); Laparoscopic bilateral salpingectomy (2010); Cholecystectomy (2007); Septoplasty (Bilateral, 09/26/2020); and Turbinate reduction (Bilateral, 09/26/2020). ?Her family history includes Bone cancer in her sister; Diabetes in her father and mother; Hypertension in her father and mother; Rheum arthritis in her mother. ?She  reports that she has never smoked. She has never used smokeless tobacco. She reports that she does not drink alcohol and does not use drugs. ?She has a current medication list which includes the following prescription(s): mirtazapine, NON FORMULARY, and triamcinolone. ?She is allergic to topamax [topiramate] and vicodin [hydrocodone-acetaminophen]. ?      ?Review of Systems:  ?Review of Systems ? ?Constitutional: Denied constitutional symptoms, night sweats, recent illness, fatigue, fever, insomnia and weight loss.  ?Eyes: Denied eye symptoms, eye pain, photophobia, vision change and visual disturbance.  ?Ears/Nose/Throat/Neck: Denied ear, nose, throat or neck symptoms, hearing loss, nasal discharge, sinus congestion and sore throat.  ?Cardiovascular: Denied cardiovascular symptoms, arrhythmia, chest pain/pressure, edema, exercise intolerance, orthopnea and palpitations.  ?Respiratory: Denied pulmonary symptoms, asthma, pleuritic pain, productive sputum, cough, dyspnea and wheezing.  ?Gastrointestinal: Denied, gastro-esophageal reflux, melena, nausea and vomiting.  ?Genitourinary: See HPI for additional information.  ?Musculoskeletal: Denied musculoskeletal symptoms, stiffness, swelling, muscle weakness and myalgia.  ?Dermatologic: Denied dermatology symptoms, rash and scar.  ?Neurologic: Denied neurology symptoms, dizziness, headache, neck pain and syncope.  ?Psychiatric: Denied psychiatric symptoms, anxiety and depression.  ?Endocrine:  Denied endocrine symptoms including hot flashes and night sweats.  ? ?Meds: ?  ?  Current Outpatient Medications on File Prior to Visit  ?Medication Sig Dispense Refill  ? mirtazapine (REMERON) 7.5 MG tablet     ? NON FORMULARY B 12 injection    ? triamcinolone (NASACORT) 55 MCG/ACT AERO nasal inhaler Place 2 sprays into the nose daily as needed.    ? ?No current facility-administered medications on file prior to visit.  ? ? ?Assessment:  ?  ?G4P3010 ?Patient Active Problem List  ? Diagnosis Date Noted  ? Leg pain 07/26/2020  ? Tachycardia 05/10/2020  ? Seasonal allergic rhinitis due to pollen 02/22/2020  ? 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 Hyperlipidemia, mild 11/18/2017  ? Sore throat, chronic 11/17/2016  ? Adult BMI 31.0-31.9 kg/sq m 04/19/2015  ? ?  ?1. Adenomyosis   ? ? Patient has symptoms and an ultrasound that are most likely consistent with adenomyosis.  She has a family history of endometriosis ? Additionally, she has symptoms that may be pelvic relaxation related.  This is not her primary issue at this time. ? ?Plan:  ?  ?       ? 1.  She would like to use something for cycle control to help with her adenomyosis symptoms.  We have discussed OCPs and IUD and she has chosen IUD.  She will return with her menses for Mirena IUD insertion. ? ?Orders ?No orders of the defined types were placed in this encounter. ? ? No orders of the defined types were placed in this encounter. ?  ?  F/U ? Return for She is to call at the start of next menses. ?I spent 22 minutes involved in the care of this patient preparing to see the patient by obtaining and reviewing her medical history (including labs, imaging tests and prior procedures), documenting clinical information in the electronic health record (EHR), counseling and coordinating care plans, writing and sending prescriptions, ordering tests or procedures and in direct communicating with the patient and medical staff discussing pertinent items from her history and  physical exam. ? ? ?Finis Bud, M.D. ?03/24/2022 ?8:01 AM ? ?  ?

## 2022-05-13 ENCOUNTER — Other Ambulatory Visit: Payer: Self-pay

## 2022-05-13 DIAGNOSIS — E038 Other specified hypothyroidism: Secondary | ICD-10-CM

## 2022-05-13 NOTE — Progress Notes (Signed)
Ordered per Dr. Amalia Hailey.

## 2022-05-19 ENCOUNTER — Other Ambulatory Visit: Payer: Managed Care, Other (non HMO)

## 2022-05-19 DIAGNOSIS — E038 Other specified hypothyroidism: Secondary | ICD-10-CM

## 2022-05-20 LAB — THYROID PANEL
Free Thyroxine Index: 1.7 (ref 1.2–4.9)
T3 Uptake Ratio: 25 % (ref 24–39)
T4, Total: 6.6 ug/dL (ref 4.5–12.0)

## 2022-06-11 ENCOUNTER — Ambulatory Visit (INDEPENDENT_AMBULATORY_CARE_PROVIDER_SITE_OTHER): Payer: Managed Care, Other (non HMO) | Admitting: Obstetrics and Gynecology

## 2022-06-11 ENCOUNTER — Encounter: Payer: Self-pay | Admitting: Obstetrics and Gynecology

## 2022-06-11 DIAGNOSIS — R102 Pelvic and perineal pain: Secondary | ICD-10-CM

## 2022-06-11 DIAGNOSIS — Z01419 Encounter for gynecological examination (general) (routine) without abnormal findings: Secondary | ICD-10-CM | POA: Diagnosis not present

## 2022-06-11 DIAGNOSIS — Z1231 Encounter for screening mammogram for malignant neoplasm of breast: Secondary | ICD-10-CM

## 2022-06-11 NOTE — Progress Notes (Signed)
HPI:      [name] is a 41 y.o. [phone] who LMP was No LMP recorded.  Subjective:   She presents today for her annual examination.  She states that she has intermittent pelvic pain.  Based on ultrasound and pain symptoms it seems as if she has adenomyosis.  She does report that she occasionally has right-sided intravaginal pain which she reports is sharp and stabbing almost like a shock.  Sometimes related to increased activity. She has decided that she would like to try an IUD for suspected adenomyosis she just has not had the right opportunity to present to have it inserted. Her husband has a vasectomy for birth control.    Hx: The following portions of the patient's history were reviewed and updated as appropriate:             She  has a past medical history of Basal cell carcinoma, Complication of anesthesia, Elevated glucose tolerance test, Family history of adverse reaction to anesthesia, Fetal size inconsistent with dates, GERD (gastroesophageal reflux disease), History of dysplastic nevus (11/16/2019), preeclampsia, prior pregnancy, currently pregnant (2008), Morbid obesity with BMI of 40.0-44.9, adult ([place]), Prior fetal macrosomia, antepartum, PROM (premature rupture of membranes) (03/21/2015), Sinus tachycardia, Sleep apnea, Urinary tract infection, recurrent, and Vertigo. She does not have any pertinent problems on file. She  has a past surgical history that includes Appendectomy (2010); Laparoscopic bilateral salpingectomy (2010); Cholecystectomy (2007); Septoplasty (Bilateral, 09/26/2020); and Turbinate reduction (Bilateral, 09/26/2020). Her family history includes Bone cancer in her sister; Diabetes in her father and mother; Hypertension in her father and mother; Rheum arthritis in her mother. She  reports that she has never smoked. She has never used smokeless tobacco. She reports that she does not drink alcohol and does not use drugs. She has a current medication list which  includes the following prescription(s): mirtazapine, NON FORMULARY, and triamcinolone. She is allergic to topamax [topiramate] and vicodin [hydrocodone-acetaminophen].       Review of Systems:  Review of Systems  Constitutional: Denied constitutional symptoms, night sweats, recent illness, fatigue, fever, insomnia and weight loss.  Eyes: Denied eye symptoms, eye pain, photophobia, vision change and visual disturbance.  Ears/Nose/Throat/Neck: Denied ear, nose, throat or neck symptoms, hearing loss, nasal discharge, sinus congestion and sore throat.  Cardiovascular: Denied cardiovascular symptoms, arrhythmia, chest pain/pressure, edema, exercise intolerance, orthopnea and palpitations.  Respiratory: Denied pulmonary symptoms, asthma, pleuritic pain, productive sputum, cough, dyspnea and wheezing.  Gastrointestinal: Denied, gastro-esophageal reflux, melena, nausea and vomiting.  Genitourinary: See HPI for additional information.  Musculoskeletal: Denied musculoskeletal symptoms, stiffness, swelling, muscle weakness and myalgia.  Dermatologic: Denied dermatology symptoms, rash and scar.  Neurologic: Denied neurology symptoms, dizziness, headache, neck pain and syncope.  Psychiatric: Denied psychiatric symptoms, anxiety and depression.  Endocrine: Denied endocrine symptoms including hot flashes and night sweats.   Meds:   Current Outpatient Medications on File Prior to Visit  Medication Sig Dispense Refill   mirtazapine (REMERON) 7.5 MG tablet      NON FORMULARY B 12 injection     triamcinolone (NASACORT) 55 MCG/ACT AERO nasal inhaler Place 2 sprays into the nose daily as needed.     No current facility-administered medications on file prior to visit.     Objective:     There were no vitals filed for this visit.  There were no vitals filed for this visit.            Physical examination General NAD, Conversant  HEENT Atraumatic; Op  clear with mmm.  Normo-cephalic. Pupils reactive.  Anicteric sclerae  Thyroid/Neck Smooth without nodularity or enlargement. Normal ROM.  Neck Supple.  Skin No rashes, lesions or ulceration. Normal palpated skin turgor. No nodularity.  Breasts: No masses or discharge.  Symmetric.  No axillary adenopathy.  Lungs: Clear to auscultation.No rales or wheezes. Normal Respiratory effort, no retractions.  Heart: NSR.  No murmurs or rubs appreciated. No periferal edema  Abdomen: Soft.  Non-tender.  No masses.  No HSM. No hernia  Extremities: Moves all appropriately.  Normal ROM for age. No lymphadenopathy.  Neuro: Oriented to PPT.  Normal mood. Normal affect.     Pelvic:   Vulva: Normal appearance.  No lesions.  Vagina: No lesions or abnormalities noted.  Support: Normal pelvic support.  Urethra No masses tenderness or scarring.  Meatus Normal size without lesions or prolapse.  Cervix: Normal appearance.  No lesions.  Anus: Normal exam.  No lesions.  Perineum: Normal exam.  No lesions.        Bimanual   Uterus: Normal size.  Non-tender.  Mobile.  AV.  Adnexae: No masses.  Non-tender to palpation.  Cul-de-sac: Negative for abnormality.     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 Well woman exam with routine gynecological exam   2. Screening mammogram for breast cancer   3. Pelvic pain in female     Pelvic pain likely consistent with adenomyosis based on symptoms and ultrasound findings.   Plan:            1.  Basic Screening Recommendations The basic screening recommendations for asymptomatic women were discussed with the patient during her visit.  The age-appropriate recommendations were discussed with her and the rational for the tests  reviewed.  When I am informed by the patient that another primary care physician has previously obtained the age-appropriate tests and they are up-to-date, only outstanding tests are ordered and referrals given as necessary.  Abnormal results of tests will be discussed with her when all of her results are completed.  Routine preventative health maintenance measures emphasized: Exercise/Diet/Weight control, Tobacco Warnings, Alcohol/Substance use risks and Stress Management  2.  Patient would like to return for IUD placement to attempt to treat her adenomyosis symptoms. Orders Orders Placed This Encounter  Procedures   MM DIGITAL SCREENING BILATERAL    No orders of the defined types were placed in this encounter.         F/U  Return in about 2 weeks (around 06/25/2022).  Finis Bud, M.D. 06/11/2022 9:44 AM

## 2022-06-11 NOTE — Progress Notes (Signed)
Patients presents for annual exam today. She states regular menstrual cycles, heavy bleeding with moderate cramping. She is still interested in an IUD, states she will schedule an appointment for insertion. Patient is up to date on pap smear. Due for mammogram, ordered. Annual labs are deferred, recently done with PCP. Patient states no other questions or concerns at this time.

## 2022-06-24 ENCOUNTER — Ambulatory Visit: Payer: Managed Care, Other (non HMO) | Admitting: Obstetrics and Gynecology

## 2022-06-24 ENCOUNTER — Encounter: Payer: Self-pay | Admitting: Obstetrics and Gynecology

## 2022-06-24 VITALS — BP 131/87 | HR 73 | Ht 66.0 in | Wt 258.8 lb

## 2022-06-24 DIAGNOSIS — Z3202 Encounter for pregnancy test, result negative: Secondary | ICD-10-CM | POA: Diagnosis not present

## 2022-06-24 DIAGNOSIS — Z30430 Encounter for insertion of intrauterine contraceptive device: Secondary | ICD-10-CM

## 2022-06-24 DIAGNOSIS — N8003 Adenomyosis of the uterus: Secondary | ICD-10-CM

## 2022-06-24 LAB — POCT URINE PREGNANCY: Preg Test, Ur: NEGATIVE

## 2022-06-24 MED ORDER — LEVONORGESTREL 20 MCG/DAY IU IUD
1.0000 | INTRAUTERINE_SYSTEM | Freq: Once | INTRAUTERINE | Status: AC
  Administered 2022-06-24: 1 via INTRAUTERINE

## 2022-06-24 NOTE — Progress Notes (Signed)
Patient presents today for IUD insertion. She states she is nervous but ready for something to help with cycle control. UPT negative. Patient states no other questions or concerns.

## 2022-06-24 NOTE — Progress Notes (Signed)
HPI:      [name] is a 41 y.o. [phone] who LMP was No LMP recorded.  Subjective:   She presents today for IUD insertion.  She has significant dysmenorrhea and ultrasound showing possibility of adenomyosis.  She would like an IUD for birth control and cycle control.    Hx: The following portions of the patient's history were reviewed and updated as appropriate:             She  has a past medical history of Basal cell carcinoma, Complication of anesthesia, Elevated glucose tolerance test, Family history of adverse reaction to anesthesia, Fetal size inconsistent with dates, GERD (gastroesophageal reflux disease), History of dysplastic nevus (11/16/2019), preeclampsia, prior pregnancy, currently pregnant (2008), Morbid obesity with BMI of 40.0-44.9, adult ([place]), Prior fetal macrosomia, antepartum, PROM (premature rupture of membranes) (03/21/2015), Sinus tachycardia, Sleep apnea, Urinary tract infection, recurrent, and Vertigo. She does not have any pertinent problems on file. She  has a past surgical history that includes Appendectomy (2010); Laparoscopic bilateral salpingectomy (2010); Cholecystectomy (2007); Septoplasty (Bilateral, 09/26/2020); and Turbinate reduction (Bilateral, 09/26/2020). Her family history includes Bone cancer in her sister; Diabetes in her father and mother; Hypertension in her father and mother; Rheum arthritis in her mother. She  reports that she has never smoked. She has never used smokeless tobacco. She reports that she does not drink alcohol and does not use drugs. She has a current medication list which includes the following prescription(s): mirtazapine, NON FORMULARY, and triamcinolone. She is allergic to topamax [topiramate] and vicodin [hydrocodone-acetaminophen].       Review of Systems:  Review of Systems  Constitutional: Denied constitutional symptoms, night sweats, recent illness, fatigue, fever, insomnia and weight loss.  Eyes: Denied eye  symptoms, eye pain, photophobia, vision change and visual disturbance.  Ears/Nose/Throat/Neck: Denied ear, nose, throat or neck symptoms, hearing loss, nasal discharge, sinus congestion and sore throat.  Cardiovascular: Denied cardiovascular symptoms, arrhythmia, chest pain/pressure, edema, exercise intolerance, orthopnea and palpitations.  Respiratory: Denied pulmonary symptoms, asthma, pleuritic pain, productive sputum, cough, dyspnea and wheezing.  Gastrointestinal: Denied, gastro-esophageal reflux, melena, nausea and vomiting.  Genitourinary: CH  Musculoskeletal: Denied musculoskeletal symptoms, stiffness, swelling, muscle weakness and myalgia.  Dermatologic: Denied dermatology symptoms, rash and scar.  Neurologic: Denied neurology symptoms, dizziness, headache, neck pain and syncope.  Psychiatric: Denied psychiatric symptoms, anxiety and depression.  Endocrine: Denied endocrine symptoms including hot flashes and night sweats.   Meds:   Current Outpatient Medications on File Prior to Visit  Medication Sig Dispense Refill   mirtazapine (REMERON) 7.5 MG tablet      NON FORMULARY B 12 injection     triamcinolone (NASACORT) 55 MCG/ACT AERO nasal inhaler Place 2 sprays into the nose daily as needed.     No current facility-administered medications on file prior to visit.    Objective:     Vitals:   06/24/22 0909  BP: 131/87  Pulse: 73    Physical examination   Pelvic:   Vulva: Normal appearance.  No lesions.  Vagina: No lesions or abnormalities noted.  Support: Normal pelvic support.  Urethra No masses tenderness or scarring.  Meatus Normal size without lesions or prolapse.  Cervix: Normal appearance.  No lesions.  Anus: Normal exam.  No lesions.  Perineum: Normal exam.  No lesions.        Bimanual   Uterus: Normal size.  Non-tender.  Mobile.  AV.  Adnexae: No masses.  Non-tender to palpation.  Cul-de-sac: Negative for abnormality.  IUD Procedure Pt has read the booklet  and signed the appropriate forms regarding the Mirena IUD.  All of her questions have been answered.   The cervix was cleansed with betadine solution.  After sounding the uterus and noting the position, the IUD was placed in the usual manner without problem.  The string was cut to the appropriate length.  The patient tolerated the procedure well.            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 Encounter for IUD insertion   2. Adenomyosis       Plan:             F/U  Return in about 4 weeks (around 07/22/2022) for For IUD f/u.  Finis Bud, M.D. 06/24/2022 9:36 AM

## 2022-07-21 ENCOUNTER — Encounter: Payer: Self-pay | Admitting: Obstetrics and Gynecology

## 2022-07-21 ENCOUNTER — Ambulatory Visit: Payer: Managed Care, Other (non HMO) | Admitting: Obstetrics and Gynecology

## 2022-07-21 VITALS — BP 139/81 | HR 71 | Ht 66.0 in | Wt 255.6 lb

## 2022-07-21 DIAGNOSIS — Z30431 Encounter for routine checking of intrauterine contraceptive device: Secondary | ICD-10-CM

## 2022-07-21 NOTE — Progress Notes (Signed)
Patient presents today for IUD string check. She states she is still bleeding . Patient reports no pain, discomfort or trouble with intercourse. No other questions or concerns.

## 2022-07-21 NOTE — Progress Notes (Signed)
HPI:      [name] is a 41 y.o. [phone] who LMP was No LMP recorded. (Menstrual status: IUD).  Subjective:   She presents today for follow-up of IUD.  She reports no problems with it.  She states her period is already much lighter than usual and this makes her happy.  She has had intercourse without issue.    Hx: The following portions of the patient's history were reviewed and updated as appropriate:             She  has a past medical history of Basal cell carcinoma, Complication of anesthesia, Elevated glucose tolerance test, Family history of adverse reaction to anesthesia, Fetal size inconsistent with dates, GERD (gastroesophageal reflux disease), History of dysplastic nevus (11/16/2019), preeclampsia, prior pregnancy, currently pregnant (2008), Morbid obesity with BMI of 40.0-44.9, adult ([facility]), Prior fetal macrosomia, antepartum, PROM (premature rupture of membranes) (03/21/2015), Sinus tachycardia, Sleep apnea, Urinary tract infection, recurrent, and Vertigo. She does not have any pertinent problems on file. She  has a past surgical history that includes Appendectomy (2010); Laparoscopic bilateral salpingectomy (2010); Cholecystectomy (2007); Septoplasty (Bilateral, 09/26/2020); and Turbinate reduction (Bilateral, 09/26/2020). Her family history includes Bone cancer in her sister; Diabetes in her father and mother; Hypertension in her father and mother; Rheum arthritis in her mother. She  reports that she has never smoked. She has never used smokeless tobacco. She reports that she does not drink alcohol and does not use drugs. She has a current medication list which includes the following prescription(s): mirtazapine, NON FORMULARY, and triamcinolone. She is allergic to topamax [topiramate] and vicodin [hydrocodone-acetaminophen].       Review of Systems:  Review of Systems  Constitutional: Denied constitutional symptoms, night sweats, recent illness, fatigue, fever, insomnia and  weight loss.  Eyes: Denied eye symptoms, eye pain, photophobia, vision change and visual disturbance.  Ears/Nose/Throat/Neck: Denied ear, nose, throat or neck symptoms, hearing loss, nasal discharge, sinus congestion and sore throat.  Cardiovascular: Denied cardiovascular symptoms, arrhythmia, chest pain/pressure, edema, exercise intolerance, orthopnea and palpitations.  Respiratory: Denied pulmonary symptoms, asthma, pleuritic pain, productive sputum, cough, dyspnea and wheezing.  Gastrointestinal: Denied, gastro-esophageal reflux, melena, nausea and vomiting.  Genitourinary: Denied genitourinary symptoms including symptomatic vaginal discharge, pelvic relaxation issues, and urinary complaints.  Musculoskeletal: Denied musculoskeletal symptoms, stiffness, swelling, muscle weakness and myalgia.  Dermatologic: Denied dermatology symptoms, rash and scar.  Neurologic: Denied neurology symptoms, dizziness, headache, neck pain and syncope.  Psychiatric: Denied psychiatric symptoms, anxiety and depression.  Endocrine: Denied endocrine symptoms including hot flashes and night sweats.   Meds:   Current Outpatient Medications on File Prior to Visit  Medication Sig Dispense Refill   mirtazapine (REMERON) 7.5 MG tablet      NON FORMULARY B 12 injection     triamcinolone (NASACORT) 55 MCG/ACT AERO nasal inhaler Place 2 sprays into the nose daily as needed.     No current facility-administered medications on file prior to visit.      Objective:     Vitals:   07/21/22 0935  BP: 139/81  Pulse: 71   Filed Weights   07/21/22 0935  Weight: 255 lb 9.6 oz (115.9 kg)              Physical examination   Pelvic:   Vulva: Normal appearance.  No lesions.  Vagina: No lesions or abnormalities noted.  Support: Normal pelvic support.  Urethra No masses tenderness or scarring.  Meatus Normal size without lesions or prolapse.  Cervix:  Normal appearance.  No lesions. IUD strings noted at cervical os.   Anus: Normal exam.  No lesions.  Perineum: Normal exam.  No lesions.        Bimanual   Uterus: Normal size.  Non-tender.  Mobile.  AV.  Adnexae: No masses.  Non-tender to palpation.  Cul-de-sac: Negative for abnormality.             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 Encounter for routine checking of intrauterine contraceptive device (IUD)        Plan:            1.  Follow-up for annual examination. Orders No orders of the defined types were placed in this encounter.   No orders of the defined types were placed in this encounter.     F/U  No follow-ups on file. I spent 14 minutes involved in the care of this patient preparing to see the patient by obtaining and reviewing her medical history (including labs, imaging tests and prior procedures), documenting clinical information in the electronic health record (EHR), counseling and coordinating care plans, writing and sending prescriptions, ordering tests or procedures and in direct communicating with the patient and medical staff discussing pertinent items from her history and physical exam.  Finis Bud, M.D. 07/21/2022 10:13 AM

## 2022-09-07 ENCOUNTER — Encounter (INDEPENDENT_AMBULATORY_CARE_PROVIDER_SITE_OTHER): Payer: Self-pay

## 2022-10-05 ENCOUNTER — Ambulatory Visit
Admission: RE | Admit: 2022-10-05 | Discharge: 2022-10-05 | Disposition: A | Discharge status: Home / Self Care | Payer: Managed Care, Other (non HMO) | Source: Ambulatory Visit | Attending: Obstetrics and Gynecology | Admitting: Obstetrics and Gynecology

## 2022-10-05 DIAGNOSIS — Z1231 Encounter for screening mammogram for malignant neoplasm of breast: Secondary | ICD-10-CM | POA: Diagnosis not present

## 2022-10-05 DIAGNOSIS — Z01419 Encounter for gynecological examination (general) (routine) without abnormal findings: Secondary | ICD-10-CM

## 2022-12-09 ENCOUNTER — Ambulatory Visit: Payer: Managed Care, Other (non HMO) | Admitting: Dermatology

## 2022-12-09 VITALS — BP 146/92 | HR 82

## 2022-12-09 DIAGNOSIS — Z1283 Encounter for screening for malignant neoplasm of skin: Secondary | ICD-10-CM

## 2022-12-09 DIAGNOSIS — L570 Actinic keratosis: Secondary | ICD-10-CM | POA: Diagnosis not present

## 2022-12-09 DIAGNOSIS — L578 Other skin changes due to chronic exposure to nonionizing radiation: Secondary | ICD-10-CM

## 2022-12-09 DIAGNOSIS — Z86018 Personal history of other benign neoplasm: Secondary | ICD-10-CM | POA: Diagnosis not present

## 2022-12-09 DIAGNOSIS — L814 Other melanin hyperpigmentation: Secondary | ICD-10-CM

## 2022-12-09 DIAGNOSIS — L821 Other seborrheic keratosis: Secondary | ICD-10-CM

## 2022-12-09 DIAGNOSIS — L820 Inflamed seborrheic keratosis: Secondary | ICD-10-CM | POA: Diagnosis not present

## 2022-12-09 DIAGNOSIS — Z85828 Personal history of other malignant neoplasm of skin: Secondary | ICD-10-CM | POA: Diagnosis not present

## 2022-12-09 DIAGNOSIS — D229 Melanocytic nevi, unspecified: Secondary | ICD-10-CM

## 2022-12-09 NOTE — Progress Notes (Signed)
Follow-Up Visit   Subjective  Hannah Hudson is a 42 y.o. female who presents for the following: Annual Exam (The patient presents for Total-Body Skin Exam (TBSE) for skin cancer screening and mole check.  The patient has spots, moles and lesions to be evaluated, some may be new or changing and the patient has concerns that these could be cancer. ). Hx of BCC, hx of Dysplastic nevus.   The following portions of the chart were reviewed this encounter and updated as appropriate:   Tobacco  Allergies  Meds  Problems  Med Hx  Surg Hx  Fam Hx     Review of Systems:  No other skin or systemic complaints except as noted in HPI or Assessment and Plan.  Objective  Well appearing patient in no apparent distress; mood and affect are within normal limits.  A full examination was performed including scalp, head, eyes, ears, nose, lips, neck, chest, axillae, abdomen, back, buttocks, bilateral upper extremities, bilateral lower extremities, hands, feet, fingers, toes, fingernails, and toenails. All findings within normal limits unless otherwise noted below.  forehead x 3 (3) Erythematous thin papules/macules with gritty scale.   right medial breast x 1, right lateral braline x 1, left lateral brow x 1  (3) (3) Stuck-on, waxy, tan-brown papules -- Discussed benign etiology and prognosis.    Assessment & Plan  AK (actinic keratosis) (3) forehead x 3 Actinic keratoses are precancerous spots that appear secondary to cumulative UV radiation exposure/sun exposure over time. They are chronic with expected duration over 1 year. A portion of actinic keratoses will progress to squamous cell carcinoma of the skin. It is not possible to reliably predict which spots will progress to skin cancer and so treatment is recommended to prevent development of skin cancer.  Recommend daily broad spectrum sunscreen SPF 30+ to sun-exposed areas, reapply every 2 hours as needed.  Recommend staying in the shade or  wearing long sleeves, sun glasses (UVA+UVB protection) and wide brim hats (4-inch brim around the entire circumference of the hat). Call for new or changing lesions.   Destruction of lesion - forehead x 3 Complexity: simple   Destruction method: cryotherapy   Informed consent: discussed and consent obtained   Timeout:  patient name, date of birth, surgical site, and procedure verified Lesion destroyed using liquid nitrogen: Yes   Region frozen until ice ball extended beyond lesion: Yes   Outcome: patient tolerated procedure well with no complications   Post-procedure details: wound care instructions given    Inflamed seborrheic keratosis (3) right medial breast x 1, right lateral braline x 1, left lateral brow x 1  (3) Symptomatic, irritating, patient would like treated.  Destruction of lesion - right medial breast x 1, right lateral braline x 1, left lateral brow x 1  (3) Complexity: simple   Destruction method: cryotherapy   Informed consent: discussed and consent obtained   Timeout:  patient name, date of birth, surgical site, and procedure verified Lesion destroyed using liquid nitrogen: Yes   Region frozen until ice ball extended beyond lesion: Yes   Outcome: patient tolerated procedure well with no complications   Post-procedure details: wound care instructions given    Lentigines - Scattered tan macules - Due to sun exposure - Benign-appearing, observe - Recommend daily broad spectrum sunscreen SPF 30+ to sun-exposed areas, reapply every 2 hours as needed. - Call for any changes  Seborrheic Keratoses - Stuck-on, waxy, tan-brown papules and/or plaques  - Benign-appearing - Discussed benign  etiology and prognosis. - Observe - Call for any changes  Melanocytic Nevi - Tan-brown and/or pink-flesh-colored symmetric macules and papules - Benign appearing on exam today - Observation - Call clinic for new or changing moles - Recommend daily use of broad spectrum spf 30+  sunscreen to sun-exposed areas.   Hemangiomas - Red papules - Discussed benign nature - Observe - Call for any changes  Actinic Damage - Chronic condition, secondary to cumulative UV/sun exposure - diffuse scaly erythematous macules with underlying dyspigmentation - Recommend daily broad spectrum sunscreen SPF 30+ to sun-exposed areas, reapply every 2 hours as needed.  - Staying in the shade or wearing long sleeves, sun glasses (UVA+UVB protection) and wide brim hats (4-inch brim around the entire circumference of the hat) are also recommended for sun protection.  - Call for new or changing lesions.  Acrochordons (Skin Tags) Axilla  - Fleshy, skin-colored pedunculated papules - Benign appearing.  - Observe. - If desired, they can be removed with an in office procedure that is not covered by insurance. - Please call the clinic if you notice any new or changing lesions.   History of Dysplastic Nevi Left ant distal medial thigh/mod 11/16/2019  - No evidence of recurrence today - Recommend regular full body skin exams - Recommend daily broad spectrum sunscreen SPF 30+ to sun-exposed areas, reapply every 2 hours as needed.  - Call if any new or changing lesions are noted between office visits   History of Basal Cell Carcinoma of the Skin Right neck  - No evidence of recurrence today - Recommend regular full body skin exams - Recommend daily broad spectrum sunscreen SPF 30+ to sun-exposed areas, reapply every 2 hours as needed.  - Call if any new or changing lesions are noted between office visits   Skin cancer screening performed today.   Return in about 1 year (around 12/10/2023) for TBSE, hx of BCC, hx of Dysplastic nevus .  IMarye Round, CMA, am acting as scribe for Sarina Ser, MD .  Documentation: I have reviewed the above documentation for accuracy and completeness, and I agree with the above.  Sarina Ser, MD

## 2022-12-09 NOTE — Patient Instructions (Addendum)
Cryotherapy Aftercare  Wash gently with soap and water everyday.   Apply Vaseline and Band-Aid daily until healed.     Due to recent changes in healthcare laws, you may see results of your pathology and/or laboratory studies on MyChart before the doctors have had a chance to review them. We understand that in some cases there may be results that are confusing or concerning to you. Please understand that not all results are received at the same time and often the doctors may need to interpret multiple results in order to provide you with the best plan of care or course of treatment. Therefore, we ask that you please give us 2 business days to thoroughly review all your results before contacting the office for clarification. Should we see a critical lab result, you will be contacted sooner.   If You Need Anything After Your Visit  If you have any questions or concerns for your doctor, please call our main line at [phone] and press option 4 to reach your doctor's medical assistant. If no one answers, please leave a voicemail as directed and we will return your call as soon as possible. Messages left after 4 pm will be answered the following business day.   You may also send us a message via MyChart. We typically respond to MyChart messages within 1-2 business days.  For prescription refills, please ask your pharmacy to contact our office. Our fax number is [phone].  If you have an urgent issue when the clinic is closed that cannot wait until the next business day, you can page your doctor at the number below.    Please note that while we do our best to be available for urgent issues outside of office hours, we are not available 24/7.   If you have an urgent issue and are unable to reach us, you may choose to seek medical care at your doctor's office, retail clinic, urgent care center, or emergency room.  If you have a medical emergency, please immediately call 911 or go to the  emergency department.  Pager Numbers  - Dr. Kowalski: [phone]  - Dr. Moye: [phone]  - Dr. Stewart: [phone]  In the event of inclement weather, please call our main line at [phone] for an update on the status of any delays or closures.  Dermatology Medication Tips: Please keep the boxes that topical medications come in in order to help keep track of the instructions about where and how to use these. Pharmacies typically print the medication instructions only on the boxes and not directly on the medication tubes.   If your medication is too expensive, please contact our office at [phone] option 4 or send us a message through MyChart.   We are unable to tell what your co-pay for medications will be in advance as this is different depending on your insurance coverage. However, we may be able to find a substitute medication at lower cost or fill out paperwork to get insurance to cover a needed medication.   If a prior authorization is required to get your medication covered by your insurance company, please allow us 1-2 business days to complete this process.  Drug prices often vary depending on where the prescription is filled and some pharmacies may offer cheaper prices.  The website www.goodrx.com contains coupons for medications through different pharmacies. The prices here do not account for what the cost may be with help from insurance (it may be cheaper with your insurance), but the website can   give you the price if you did not use any insurance.  - You can print the associated coupon and take it with your prescription to the pharmacy.  - You may also stop by our office during regular business hours and pick up a GoodRx coupon card.  - If you need your prescription sent electronically to a different pharmacy, notify our office through [facility] [place] MyChart or by phone at [phone] option 4.     Si Usted Necesita Algo Despus de Su Visita  Tambin puede  enviarnos un mensaje a travs de MyChart. Por lo general respondemos a los mensajes de MyChart en el transcurso de 1 a 2 das hbiles.  Para renovar recetas, por favor pida a su farmacia que se ponga en contacto con nuestra oficina. Nuestro nmero de fax es el [phone].  Si tiene un asunto urgente cuando la clnica est cerrada y que no puede esperar hasta el siguiente da hbil, puede llamar/localizar a su doctor(a) al nmero que aparece a continuacin.   Por favor, tenga en cuenta que aunque hacemos todo lo posible para estar disponibles para asuntos urgentes fuera del horario de oficina, no estamos disponibles las 24 horas del da, los 7 das de la semana.   Si tiene un problema urgente y no puede comunicarse con nosotros, puede optar por buscar atencin mdica  en el consultorio de su doctor(a), en una clnica privada, en un centro de atencin urgente o en una sala de emergencias.  Si tiene una emergencia mdica, por favor llame inmediatamente al 911 o vaya a la sala de emergencias.  Nmeros de bper  - Dr. Kowalski: [phone]  - Dra. Moye: [phone]  - Dra. Stewart: [phone]  En caso de inclemencias del tiempo, por favor llame a nuestra lnea principal al [phone] para una actualizacin sobre el estado de cualquier retraso o cierre.  Consejos para la medicacin en dermatologa: Por favor, guarde las cajas en las que vienen los medicamentos de uso tpico para ayudarle a seguir las instrucciones sobre dnde y cmo usarlos. Las farmacias generalmente imprimen las instrucciones del medicamento slo en las cajas y no directamente en los tubos del medicamento.   Si su medicamento es muy caro, por favor, pngase en contacto con nuestra oficina llamando al [phone] y presione la opcin 4 o envenos un mensaje a travs de MyChart.   No podemos decirle cul ser su copago por los medicamentos por adelantado ya que esto es diferente dependiendo de la cobertura de su seguro.  Sin embargo, es posible que podamos encontrar un medicamento sustituto a menor costo o llenar un formulario para que el seguro cubra el medicamento que se considera necesario.   Si se requiere una autorizacin previa para que su compaa de seguros cubra su medicamento, por favor permtanos de 1 a 2 das hbiles para completar este proceso.  Los precios de los medicamentos varan con frecuencia dependiendo del lugar de dnde se surte la receta y alguna farmacias pueden ofrecer precios ms baratos.  El sitio web www.goodrx.com tiene cupones para medicamentos de diferentes farmacias. Los precios aqu no tienen en cuenta lo que podra costar con la ayuda del seguro (puede ser ms barato con su seguro), pero el sitio web puede darle el precio si no utiliz ningn seguro.  - Puede imprimir el cupn correspondiente y llevarlo con su receta a la farmacia.  - Tambin puede pasar por nuestra oficina durante el horario de atencin regular y recoger una tarjeta de cupones de GoodRx.  -   Si necesita que su receta se enve electrnicamente a una farmacia diferente, informe a nuestra oficina a travs de MyChart de [place] o por telfono llamando al [phone] y presione la opcin 4.  

## 2022-12-12 ENCOUNTER — Encounter: Payer: Self-pay | Admitting: Dermatology

## 2023-08-05 ENCOUNTER — Ambulatory Visit: Payer: Managed Care, Other (non HMO) | Admitting: Certified Nurse Midwife

## 2023-08-05 ENCOUNTER — Encounter: Payer: Self-pay | Admitting: Certified Nurse Midwife

## 2023-08-05 VITALS — BP 123/73 | HR 94 | Ht 66.0 in | Wt 258.0 lb

## 2023-08-05 DIAGNOSIS — N907 Vulvar cyst: Secondary | ICD-10-CM | POA: Diagnosis not present

## 2023-08-05 DIAGNOSIS — Z975 Presence of (intrauterine) contraceptive device: Secondary | ICD-10-CM | POA: Diagnosis not present

## 2023-08-05 DIAGNOSIS — N926 Irregular menstruation, unspecified: Secondary | ICD-10-CM | POA: Diagnosis not present

## 2023-08-05 NOTE — Progress Notes (Signed)
Lynnea Ferrier, MD   Chief Complaint  Patient presents with   vaginal bump    HPI:      Hannah Hudson is a 42 y.o. [phone] whose LMP was Patient's last menstrual period was 07/05/2023., presents today for bump on vaginal lip, episode of bleeding x3w-light to spotting as well as pelvic pain which had not happened since Mirena placement. Has not had regular cycle since Mirena placement 8/23. Has had ear infection requiring prednisone, antibiotics then had COVID infection and again received prednisone prescription.    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   Hyperlipidemia, mild 11/18/2017   Sore throat, chronic 11/17/2016   Adult BMI 31.0-31.9 kg/sq m 04/19/2015    Past Surgical History:  Procedure Laterality Date   APPENDECTOMY  2010   laparoscopic   CHOLECYSTECTOMY  2007   LAPAROSCOPIC BILATERAL SALPINGECTOMY  2010   bilateral paratubal cyst excision   SEPTOPLASTY Bilateral 09/26/2020   Procedure: SEPTOPLASTY;  Surgeon: Vernie Murders, MD;  Location: [facility];  Service: ENT;  Laterality: Bilateral;   TURBINATE REDUCTION Bilateral 09/26/2020   Procedure: INFERIOR TURBINATE REDUCTION RIGHT MIDDLE;  Surgeon: Vernie Murders, MD;  Location: [facility];  Service: ENT;  Laterality: Bilateral;    Family History  Problem Relation Age of Onset   Hypertension Mother    Diabetes Mother    Rheum arthritis Mother    Hypertension Father    Diabetes Father    Bone cancer Sister    Breast cancer Neg Hx     Social History   Socioeconomic History   Marital status: Married    Spouse name: Not on file   Number of children: Not on file   Years of education: Not on file   Highest education level: Not on file  Occupational History   Occupation: Software engineer  Tobacco Use   Smoking status: Never    Smokeless tobacco: Never  Vaping Use   Vaping status: Never Used  Substance and Sexual Activity   Alcohol use: No    Alcohol/week: 0.0 standard drinks of alcohol   Drug use: No   Sexual activity: Yes    Partners: Male    Birth control/protection: I.U.D.    Comment: husband-vasectomy  Other Topics Concern   Not on file  Social History Narrative   Not on file   Social Determinants of Health   Financial Resource Strain: Low Risk  (05/24/2023)   Received from [facility]   Overall Financial Resource Strain (CARDIA)    Difficulty of Paying Living Expenses: Not hard at all  Food Insecurity: No Food Insecurity (05/24/2023)   Received from [facility]   Hunger Vital Sign    Worried About Running Out of Food in the Last Year: Never true    Ran Out of Food in the Last Year: Never true  Transportation Needs: No Transportation Needs (05/24/2023)   Received from [facility]    In the past 12 months, has lack of transportation kept you from medical appointments or from getting medications?: No    Lack of Transportation (Non-Medical): No  Physical Activity: Not on file  Stress: Not on file  Social Connections: Not on file  Intimate Partner Violence: Not on file    Outpatient Medications Prior  to Visit  Medication Sig Dispense Refill   levothyroxine (SYNTHROID) 25 MCG tablet PLEASE SEE ATTACHED FOR DETAILED DIRECTIONS     mirtazapine (REMERON) 7.5 MG tablet      NON FORMULARY B 12 injection     triamcinolone (NASACORT) 55 MCG/ACT AERO nasal inhaler Place 2 sprays into the nose daily as needed.     ZEPBOUND 5 MG/0.5ML Pen Inject into the skin.     No facility-administered medications prior to visit.      ROS:  Review of Systems  Constitutional: Negative.   Respiratory: Negative.    Cardiovascular: Negative.   Genitourinary:  Positive for menstrual problem.     OBJECTIVE:   Vitals:  BP 123/73    Pulse 94   Ht 5\' 6"  (1.676 m)   Wt 258 lb (117 kg)   LMP 07/05/2023   BMI 41.64 kg/m   Physical Exam Vitals reviewed.  Constitutional:      Appearance: Normal appearance.  Cardiovascular:     Rate and Rhythm: Normal rate.  Pulmonary:     Effort: Pulmonary effort is normal.  Genitourinary:    Cervix: Normal.     Uterus: Normal.      Adnexa: Right adnexa normal and left adnexa normal.     Comments: Left labia majora with 2 sebaceous cysts, no erythema, exudate or bleeding. IUD strings visible. Neurological:     Mental Status: She is alert.     Results: No results found for this or any previous visit (from the past 24 hour(s)).   Assessment/Plan: Irregular menstrual bleeding  IUD (intrauterine device) in place  Labial cyst  Reassured of IUD in place on exam and that irregular bleeding likely consequence of multiple illnesses, prednisone use as well as possibility of Zepbound & levothyroxine impacting cycle. Comfort measures for labial cysts reviewed.  No orders of the defined types were placed in this encounter.    Dominica Severin, CNM 08/05/2023 1:23 PM

## 2023-11-24 DIAGNOSIS — R7303 Prediabetes: Secondary | ICD-10-CM | POA: Insufficient documentation

## 2023-11-25 DIAGNOSIS — E039 Hypothyroidism, unspecified: Secondary | ICD-10-CM | POA: Insufficient documentation

## 2023-12-16 ENCOUNTER — Ambulatory Visit: Payer: Managed Care, Other (non HMO) | Admitting: Dermatology

## 2024-01-21 ENCOUNTER — Other Ambulatory Visit: Payer: Self-pay | Admitting: Internal Medicine

## 2024-01-21 DIAGNOSIS — Z1231 Encounter for screening mammogram for malignant neoplasm of breast: Secondary | ICD-10-CM

## 2024-01-25 ENCOUNTER — Ambulatory Visit: Payer: Managed Care, Other (non HMO) | Admitting: Dermatology

## 2024-01-25 ENCOUNTER — Encounter: Payer: Self-pay | Admitting: Dermatology

## 2024-01-25 DIAGNOSIS — L820 Inflamed seborrheic keratosis: Secondary | ICD-10-CM | POA: Diagnosis not present

## 2024-01-25 DIAGNOSIS — Z1283 Encounter for screening for malignant neoplasm of skin: Secondary | ICD-10-CM

## 2024-01-25 DIAGNOSIS — R21 Rash and other nonspecific skin eruption: Secondary | ICD-10-CM

## 2024-01-25 DIAGNOSIS — L918 Other hypertrophic disorders of the skin: Secondary | ICD-10-CM

## 2024-01-25 DIAGNOSIS — W908XXA Exposure to other nonionizing radiation, initial encounter: Secondary | ICD-10-CM

## 2024-01-25 DIAGNOSIS — Z85828 Personal history of other malignant neoplasm of skin: Secondary | ICD-10-CM

## 2024-01-25 DIAGNOSIS — D1801 Hemangioma of skin and subcutaneous tissue: Secondary | ICD-10-CM

## 2024-01-25 DIAGNOSIS — L578 Other skin changes due to chronic exposure to nonionizing radiation: Secondary | ICD-10-CM | POA: Diagnosis not present

## 2024-01-25 DIAGNOSIS — Z86018 Personal history of other benign neoplasm: Secondary | ICD-10-CM

## 2024-01-25 DIAGNOSIS — D229 Melanocytic nevi, unspecified: Secondary | ICD-10-CM

## 2024-01-25 DIAGNOSIS — L814 Other melanin hyperpigmentation: Secondary | ICD-10-CM

## 2024-01-25 DIAGNOSIS — L821 Other seborrheic keratosis: Secondary | ICD-10-CM

## 2024-01-25 NOTE — Patient Instructions (Signed)

## 2024-01-25 NOTE — Progress Notes (Signed)
 Follow-Up Visit   Subjective  Hannah Hudson is a 43 y.o. female who presents for the following: Skin Cancer Screening and Full Body Skin Exam  The patient presents for Total-Body Skin Exam (TBSE) for skin cancer screening and mole check. The patient has spots, moles and lesions to be evaluated, some may be new or changing and the patient may have concern these could be cancer.  Patient has noticed a lesion on the post neck and R forehead that she would like checked.   Patient c/o hives on the wrist and forearms that last about 15-20 minutes. Pt states that she doesn't wear jewelry or watches. The first time rash occurred was new year's eve after drinking champagne.    The following portions of the chart were reviewed this encounter and updated as appropriate: medications, allergies, medical history  Review of Systems:  No other skin or systemic complaints except as noted in HPI or Assessment and Plan.  Objective  Well appearing patient in no apparent distress; mood and affect are within normal limits.  A full examination was performed including scalp, head, eyes, ears, nose, lips, neck, chest, axillae, abdomen, back, buttocks, bilateral upper extremities, bilateral lower extremities, hands, feet, fingers, toes, fingernails, and toenails. All findings within normal limits unless otherwise noted below.   Relevant physical exam findings are noted in the Assessment and Plan. R sup forehead x 1, R temple x 1, L sup forehead x 1, L lower forehead x 1, L temple x 1 (5) Erythematous stuck-on, waxy papule or plaque  Assessment & Plan   SKIN CANCER SCREENING PERFORMED TODAY.  ACTINIC DAMAGE - Chronic condition, secondary to cumulative UV/sun exposure - diffuse scaly erythematous macules with underlying dyspigmentation - Recommend daily broad spectrum sunscreen SPF 30+ to sun-exposed areas, reapply every 2 hours as needed.  - Staying in the shade or wearing long sleeves, sun glasses  (UVA+UVB protection) and wide brim hats (4-inch brim around the entire circumference of the hat) are also recommended for sun protection.  - Call for new or changing lesions. - Do not recommend OTC red lights as they likely aren't very effective.  LENTIGINES, SEBORRHEIC KERATOSES, HEMANGIOMAS - Benign normal skin lesions - Benign-appearing - Call for any changes  MELANOCYTIC NEVI - Tan-brown and/or pink-flesh-colored symmetric macules and papules - Benign appearing on exam today - Observation - Call clinic for new or changing moles - Recommend daily use of broad spectrum spf 30+ sunscreen to sun-exposed areas.   HISTORY OF BASAL CELL CARCINOMA OF THE SKIN - R neck, originally dx at [facility] - No evidence of recurrence today - Recommend regular full body skin exams - Recommend daily broad spectrum sunscreen SPF 30+ to sun-exposed areas, reapply every 2 hours as needed.  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  Call if any new or changing lesions are noted between office visits  Acrochordons (Skin Tags) - Fleshy, skin-colored pedunculated papules - Benign appearing.  - Observe. - If desired, they can be removed with an in office procedure that is not covered by insurance. - Please call the clinic if you notice any new or changing lesions.  Rash Exam: Two Red patches behind left ear, wrist clear today.  Differential  diagnosis:  Idiopathic urticaria  Treatment Plan: Continue Zyrtec 10 mg daily- may increase to BID if not experiencing sedation or dryness. INFLAMED SEBORRHEIC KERATOSIS (5) R sup forehead x 1, R temple x 1, L sup forehead x 1, L lower forehead x 1, L temple x 1 (5) Symptomatic,  irritating, patient would like treated.  Destruction of lesion - R sup forehead x 1, R temple x 1, L sup forehead x 1, L lower forehead x 1, L temple x 1 (5) Complexity: simple   Destruction method: cryotherapy   Informed consent: discussed and consent obtained   Timeout:  patient name, date of birth, surgical site, and procedure verified Lesion destroyed using liquid nitrogen: Yes   Region frozen until ice ball extended beyond lesion: Yes   Outcome: patient tolerated procedure well with no complications   Post-procedure details: wound care instructions given   MULTIPLE BENIGN NEVI   LENTIGINES   ACTINIC ELASTOSIS   SEBORRHEIC KERATOSES   CHERRY ANGIOMA   ACROCHORDON   RASH    Return in about 1 year (around 01/24/2025) for TBSE.  Maylene Roes, CMA, am acting as scribe for Elie Goody, MD .  Documentation: I have reviewed the above documentation for accuracy and completeness, and I agree with the above.  Elie Goody, MD

## 2024-02-02 ENCOUNTER — Ambulatory Visit
Admission: RE | Admit: 2024-02-02 | Discharge: 2024-02-02 | Disposition: A | Discharge status: Home / Self Care | Source: Ambulatory Visit | Attending: Internal Medicine | Admitting: Internal Medicine

## 2024-02-02 DIAGNOSIS — Z1231 Encounter for screening mammogram for malignant neoplasm of breast: Secondary | ICD-10-CM | POA: Insufficient documentation

## 2025-01-30 ENCOUNTER — Ambulatory Visit: Admitting: Dermatology

## 2025-01-31 ENCOUNTER — Ambulatory Visit: Admitting: Obstetrics
